# Patient Record
Sex: Female | Born: 1975
Health system: Southern US, Community
[De-identification: ages and names within clinical notes are randomized; demographics above are authoritative.]

## PROBLEM LIST (undated history)

## (undated) DIAGNOSIS — F32A Depression, unspecified: Secondary | ICD-10-CM

## (undated) DIAGNOSIS — E039 Hypothyroidism, unspecified: Secondary | ICD-10-CM

## (undated) DIAGNOSIS — I10 Essential (primary) hypertension: Secondary | ICD-10-CM

## (undated) DIAGNOSIS — E669 Obesity, unspecified: Secondary | ICD-10-CM

## (undated) DIAGNOSIS — F419 Anxiety disorder, unspecified: Secondary | ICD-10-CM

## (undated) DIAGNOSIS — R600 Localized edema: Secondary | ICD-10-CM

## (undated) DIAGNOSIS — E559 Vitamin D deficiency, unspecified: Secondary | ICD-10-CM

## (undated) DIAGNOSIS — F329 Major depressive disorder, single episode, unspecified: Secondary | ICD-10-CM

## (undated) DIAGNOSIS — K219 Gastro-esophageal reflux disease without esophagitis: Secondary | ICD-10-CM

## (undated) DIAGNOSIS — A159 Respiratory tuberculosis unspecified: Secondary | ICD-10-CM

## (undated) DIAGNOSIS — N979 Female infertility, unspecified: Secondary | ICD-10-CM

## (undated) HISTORY — DX: Hypothyroidism, unspecified: E03.9

## (undated) HISTORY — DX: Essential (primary) hypertension: I10

## (undated) HISTORY — PX: WISDOM TOOTH EXTRACTION: SHX21

## (undated) HISTORY — DX: Vitamin D deficiency, unspecified: E55.9

## (undated) HISTORY — DX: Localized edema: R60.0

## (undated) HISTORY — DX: Obesity, unspecified: E66.9

---
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
## 1999-01-20 ENCOUNTER — Other Ambulatory Visit: Admission: RE | Admit: 1999-01-20 | Discharge: 1999-01-20 | Payer: Self-pay | Admitting: *Deleted

## 2000-01-11 ENCOUNTER — Other Ambulatory Visit: Admission: RE | Admit: 2000-01-11 | Discharge: 2000-01-11 | Payer: Self-pay | Admitting: *Deleted

## 2000-04-17 DIAGNOSIS — A159 Respiratory tuberculosis unspecified: Secondary | ICD-10-CM

## 2000-04-17 HISTORY — DX: Respiratory tuberculosis unspecified: A15.9

## 2001-02-21 ENCOUNTER — Ambulatory Visit (HOSPITAL_COMMUNITY): Admission: RE | Admit: 2001-02-21 | Discharge: 2001-02-21 | Payer: Self-pay | Admitting: Infectious Diseases

## 2001-02-21 ENCOUNTER — Encounter: Payer: Self-pay | Admitting: Infectious Diseases

## 2001-03-28 ENCOUNTER — Other Ambulatory Visit: Admission: RE | Admit: 2001-03-28 | Discharge: 2001-03-28 | Payer: Self-pay | Admitting: Obstetrics and Gynecology

## 2002-03-31 ENCOUNTER — Other Ambulatory Visit: Admission: RE | Admit: 2002-03-31 | Discharge: 2002-03-31 | Payer: Self-pay | Admitting: Obstetrics and Gynecology

## 2002-03-31 ENCOUNTER — Other Ambulatory Visit: Admission: RE | Admit: 2002-03-31 | Discharge: 2002-03-31 | Payer: Self-pay | Admitting: *Deleted

## 2002-07-07 ENCOUNTER — Other Ambulatory Visit: Admission: RE | Admit: 2002-07-07 | Discharge: 2002-07-07 | Payer: Self-pay | Admitting: Obstetrics and Gynecology

## 2003-01-02 ENCOUNTER — Ambulatory Visit (HOSPITAL_COMMUNITY): Admission: RE | Admit: 2003-01-02 | Discharge: 2003-01-02 | Payer: Self-pay | Admitting: *Deleted

## 2003-04-15 ENCOUNTER — Other Ambulatory Visit: Admission: RE | Admit: 2003-04-15 | Discharge: 2003-04-15 | Payer: Self-pay | Admitting: Obstetrics and Gynecology

## 2004-05-17 ENCOUNTER — Other Ambulatory Visit: Admission: RE | Admit: 2004-05-17 | Discharge: 2004-05-17 | Payer: Self-pay | Admitting: Gynecology

## 2005-05-23 ENCOUNTER — Other Ambulatory Visit: Admission: RE | Admit: 2005-05-23 | Discharge: 2005-05-23 | Payer: Self-pay | Admitting: Gynecology

## 2006-01-14 ENCOUNTER — Inpatient Hospital Stay (HOSPITAL_COMMUNITY): Admission: AD | Admit: 2006-01-14 | Discharge: 2006-01-14 | Payer: Self-pay | Admitting: Obstetrics and Gynecology

## 2006-02-19 ENCOUNTER — Inpatient Hospital Stay (HOSPITAL_COMMUNITY): Admission: AD | Admit: 2006-02-19 | Discharge: 2006-02-19 | Payer: Self-pay | Admitting: Obstetrics and Gynecology

## 2006-04-05 ENCOUNTER — Inpatient Hospital Stay (HOSPITAL_COMMUNITY): Admission: AD | Admit: 2006-04-05 | Discharge: 2006-04-05 | Payer: Self-pay | Admitting: Obstetrics and Gynecology

## 2006-04-11 ENCOUNTER — Inpatient Hospital Stay (HOSPITAL_COMMUNITY): Admission: AD | Admit: 2006-04-11 | Discharge: 2006-04-11 | Payer: Self-pay | Admitting: Obstetrics and Gynecology

## 2006-04-12 ENCOUNTER — Ambulatory Visit (HOSPITAL_COMMUNITY): Admission: RE | Admit: 2006-04-12 | Discharge: 2006-04-12 | Payer: Self-pay | Admitting: Obstetrics and Gynecology

## 2006-05-15 ENCOUNTER — Inpatient Hospital Stay (HOSPITAL_COMMUNITY): Admission: AD | Admit: 2006-05-15 | Discharge: 2006-05-15 | Payer: Self-pay | Admitting: Obstetrics and Gynecology

## 2006-05-18 ENCOUNTER — Inpatient Hospital Stay (HOSPITAL_COMMUNITY): Admission: AD | Admit: 2006-05-18 | Discharge: 2006-05-23 | Payer: Self-pay | Admitting: Obstetrics and Gynecology

## 2006-05-20 ENCOUNTER — Encounter (INDEPENDENT_AMBULATORY_CARE_PROVIDER_SITE_OTHER): Payer: Self-pay | Admitting: *Deleted

## 2008-01-20 ENCOUNTER — Inpatient Hospital Stay (HOSPITAL_COMMUNITY): Admission: RE | Admit: 2008-01-20 | Discharge: 2008-01-22 | Payer: Self-pay | Admitting: Obstetrics and Gynecology

## 2009-11-15 ENCOUNTER — Ambulatory Visit: Payer: Self-pay | Admitting: Family Medicine

## 2009-11-15 DIAGNOSIS — M25559 Pain in unspecified hip: Secondary | ICD-10-CM

## 2010-05-19 NOTE — Assessment & Plan Note (Signed)
Summary: NP,HIP PAIN,MC   Vital Signs:  Patient profile:   35 year old female Height:      63 inches Weight:      127.25 pounds BMI:     22.62 Pulse rate:   65 / minute BP sitting:   109 / 74  (left arm)  Vitals Entered By: Terese Door (November 15, 2009 2:42 PM) CC: NP-HIP PAIN SINCE MARCH Pain Assessment Patient in pain? yes     Location: hip Intensity: 3 OR 4   CC:  NP-HIP PAIN SINCE MARCH.  History of Present Illness: LEFT hip and upper lateral thigh pain for several months. No specific injury. No pruior surgeries--did have a steroid injection and that helped minimally. This was about 6 weeks ago. Getting ready to train for 5k in October and wants to make sure nothing is wrong with her hip.  Pain is mostly at night--cannot sleep on that side. Also painful if she sits crosslegged.  Occasionally awakens her from sleep.  Current Medications (verified): 1)  None  Allergies (verified): No Known Drug Allergies  Review of Systems       no nummbness or tingling in leg, no weakness or giving way of leg.  Physical Exam  General:  alert, well-developed, well-nourished, and well-hydrated.     Hip Exam  Sensory:    Gross coordination and sensation were normal.  Motor:    Motor strength 5/5 bilaterally for quadriceps, hamstrings, ankle dorsiflexion, ankle plantar flexion, .  Hip Exam:    Right:    Inspection:  Normal    Palpation:  Normal    Left:    Inspection:  Normal       Location:  slighhtly TTP over greater trochanter    Stability:  stable    Swelling:  no    Erythema:  no    Full IR /ER, flexion and extension of hip. Mildy painful FABER.  Adductor and abductor muscle strength symmetrical but ABductors are a little wek B.   Impression & Recommendations:  Problem # 1:  HIP PAIN, LEFT (ICD-719.45) still think there is a component og greater trochanteric bursitis. Plus some weak abductors. Start her on lateral raises for abductors, also quadriped  extensions. RTC if not improved

## 2010-08-30 NOTE — Op Note (Signed)
[name], [name]             ACCOUNT NO.:  [id number]   MEDICAL RECORD NO.:  [id number]          PATIENT TYPE:  INP   LOCATION:  9110                          FACILITY:  [facility]   PHYSICIAN:  Huel Cote, M.D. DATE OF BIRTH:  23-Jul-1975   DATE OF PROCEDURE:  DATE OF DISCHARGE:                               OPERATIVE REPORT   PREOPERATIVE DIAGNOSES:  1. Repeat cesarean section, declines vaginal birth after cesarean.  2. A 39-week gestation.   POSTOPERATIVE DIAGNOSES:  1. Repeat cesarean section, declines vaginal birth after cesarean.  2. A 39-week gestation.   PROCEDURE:  Repeat low-transverse cesarean section.   SURGEON:  Huel Cote, MD   ASSISTANT:  Zenaida Niece, MD   ANESTHESIA:  Spinal.   FINDINGS:  A viable female infant, vertex presentation, Apgars were 8  and 9, weight was 7 pounds and 8 ounces.  Normal uterus, ovaries, and  tubes were noted.   SPECIMEN:  Placenta was sent to Labor and Delivery after cord blood  donation.   ESTIMATED BLOOD LOSS:  800 mL.   URINE OUTPUT:  300 mL clear urine and 2000 mL LR.   PROCEDURE IN DETAIL:  The patient was taken to the operating room where  spinal anesthesia was obtained without difficulty.  She was then prepped  and draped in the normal sterile fashion in the dorsal supine position  with a leftward tilt.  A Pfannenstiel skin incision was then made and  carried through to the underlying layer of fascia by sharp dissection  and Bovie cautery.  Fascia was then nicked in the midline and the  incision was extended laterally with Mayo scissors.  The inferior aspect  of the incision was grasped with Kocher clamps, elevated and dissected  off the underlying rectus muscles in a similar fashion.  The superior  aspect was dissected off the rectus muscles.  These were then elevated  and separated sharply with peritoneal cavity identified and also entered  sharply.  The peritoneal incision was then extended both  superiorly and  inferiorly with careful attention to avoid both bowel and bladder.  The  Alexis self-retaining wound retractor was then placed within the  incision, and the lower uterine segment exposed nicely.  The lower  uterine segment was then incised in a transverse fashion.  The uterine  cavity itself entered bluntly.  The amniotic sac was then ruptured with  clear fluid noted.  The infant's head then delivered atraumatically and  bulb suctioned.  The remainder of the body delivered without difficulty,  and cord was clamped and cut and handed off to awaiting pediatricians.  The placenta was expressed spontaneously and handed off for cord blood  donation.  The uterus was cleared of all clots and debris with a moist  lap sponge.  The uterine incision was then closed with 0 chromic in a  running-locked fashion in the first layer, and a second layer of the  same suture in an imbricating fashion.  A small area of bleeding at the  midline was controlled with a figure-of-eight suture of 3-0 Vicryl.  The  incision then appeared  completely hemostatic.  Tubes and ovaries  appeared normal.  The gutters were cleared of all clots and debris, and  the abdomen irrigated with no further bleeding noted.  Therefore, the  rectus muscles reapproximated with several interrupted sutures of 0  Vicryl in a mattress suture interrupted, and the fascia was then closed  with 0 Vicryl in a running fashion.  The skin was closed after the  subcutaneous layer was reapproximated with 3-0 plain with staples.  Sponge, lap, and needle counts were correct x2, and the patient was  taken to the recovery room in stable condition.      Huel Cote, M.D.  Electronically Signed     KR/MEDQ  D:  01/20/2008  T:  01/20/2008  Job:  [id number]

## 2010-08-30 NOTE — H&P (Signed)
[name], [name]             ACCOUNT NO.:  [id number]   MEDICAL RECORD NO.:  [id number]          PATIENT TYPE:  INP   LOCATION:                                [facility]  [facility]   PHYSICIAN:  Huel Cote, M.D. DATE OF BIRTH:  07/10/1975   DATE OF ADMISSION:  01/20/2008  DATE OF DISCHARGE:                              HISTORY & PHYSICAL   The patient is a 35 year old G2, P 1-0-0-2, who is coming in for a  scheduled repeat C-section at 27 plus weeks' gestation given a prior C-  section and an unfavorable cervix with the patient declining a trial of  labor in this situation.  Her estimated due date is January 25, 2008 by  an 8-week ultrasound consistent with her LMP.  Her prenatal care overall  has been relatively uneventful except for the history of C-section.  In  her last pregnancy, she did have some pre-eclampsia; however, this  pregnancy, blood pressures have remained normal and no other issues.   Her prenatal labs are as follows; O positive, antibody negative, RPR  nonreactive, rubella immune, hepatitis B surface-antigen negative, HIV  negative, GC negative, Chlamydia negative, group B Strep negative, 1-  hour Glucola normal at 110.  The patient had first-trimester screening,  which was normal and an alpha-fetoprotein, which was normal.  Her  anatomic ultrasound was normal.   Her past obstetrical history as stated was significant for a prior C-  section in February 2008 with a twin gestation.  The patient had arrest  of dilation after labor and also the complication of postpartum  preeclampsia that was at 38 weeks' gestation.   Her past GYN history was normal.   PAST SURGICAL HISTORY:  The C-section as stated and a pilonidal cyst  surgery in 1994 as well as dental surgery and wisdom teeth removal in  the past.   PAST MEDICAL HISTORY:  She had a positive PPD in the past with a  negative chest x-ray.   CURRENT ALLERGIES:  None.   CURRENT MEDICATIONS:  Prenatal  vitamins.   PHYSICAL EXAMINATION:  The patient's blood pressure is 115/80.  Cardiac  exam is regular rate and rhythm.  Lungs are clear.  Abdomen is soft,  nontender, and gravid.  Cervix is 50, closed, and high.  She had a  weight of 161 pounds.  Lower extremities were normal with no significant  edema.   The risks and benefits of VBAC versus repeat C-section were discussed  with the patient in detail including bleeding, infection, and possible  damage to bowel and bladder.  She understands the risks of both  approaches and desires to proceed with a repeat C-section on January 20, 2008, if she does not labor prior to that date, as her cervix has  remained unfavorable.  She has been appropriately counseled and desires  to proceed with the surgery as stated.      Huel Cote, M.D.  Electronically Signed     KR/MEDQ  D:  01/08/2008  T:  01/09/2008  Job:  [id number]

## 2010-09-02 NOTE — Discharge Summary (Signed)
NAMECHRISTYN, [name]             ACCOUNT NO.:  [id number]   MEDICAL RECORD NO.:  [id number]          PATIENT TYPE:  INP   LOCATION:  9110                          [facility]ILITY:  [facility]   PHYSICIAN:  Huel Cote, M.D. DATE OF BIRTH:  02/12/1976   DATE OF ADMISSION:  01/20/2008  DATE OF DISCHARGE:  01/22/2008                               DISCHARGE SUMMARY   DISCHARGE DIAGNOSES:  1. Repeat cesarean section, declined vaginal birth after cesarean.  2. A 39-week gestation.  3. Status post repeat low-transverse cesarean section.   DISCHARGE MEDICATIONS:  1. Motrin 600 mg p.o. every 6 hours.  2. Percocet 1-2 tablets p.o. every 4 hours p.r.n.   DISCHARGE FOLLOWUP:  The patient is to follow up in the office in 2 days  for staple removal.   HOSPITAL COURSE:  The patient is a 35 year old G2, P1-0-0-2 who came in  for a scheduled repeat C-section given her term status and declining a  trial of labor.  For her complete H&P, please see that previously  dictated documents.  On January 20, 2008, she underwent a repeat low-  transverse C-section and was delivered of a viable female infant, Apgars  were 8 and 9, weight was 7 pounds 8 ounces.  She was then admitted for  routine postoperative care.  On postop day #1, she was doing well.  Her  hemoglobin was down 9.8 from 12.5.  She was tolerating her p.o. pain  medications.  By postop day #2, she was feeling quite well, voiding  without difficulty and her pain was well controlled.  She requested  early discharge, therefore was discharged to home to follow up in the  office in 2 days for staple removal.      Huel Cote, M.D.  Electronically Signed     KR/MEDQ  D:  02/23/2008  T:  02/23/2008  Job:  [id number]

## 2010-09-02 NOTE — Discharge Summary (Signed)
[name], [name]             ACCOUNT NO.:  [id number]   MEDICAL RECORD NO.:  [id number]          PATIENT TYPE:  INP   LOCATION:  9139                          [facility]   PHYSICIAN:  Zenaida Niece, M.D.DATE OF BIRTH:  1975/10/10   DATE OF ADMISSION:  05/18/2006  DATE OF DISCHARGE:  05/23/2006                               DISCHARGE SUMMARY   ADMISSION DIAGNOSIS:  Intrauterine pregnancy with twins at 38 weeks.   DISCHARGE DIAGNOSES:  1. Intrauterine pregnancy of twins at 38 weeks.  2. Arrest of dilation.  3. Possible postpartum preeclampsia.   HISTORY AND PHYSICAL:  This is a 35 year old white female gravida 1,  para 0 with twins with an EGA of [redacted] weeks who presents for elective  induction.  Prenatal care revealed concordant growth of the twins with  most recent vertex-transverse presentation.  She also had recent  elevated blood pressures in the office with no symptoms and normal labs.  Prenatal labs:  Blood type is O positive with a negative antibody  screen, RPR nonreactive, rubella immune, hepatitis B surface antigen  negative, HIV negative, gonorrhea and chlamydia negative, 1-hour Glucola  99, and group B strep is negative.  Past medical history:  Positive PPD  with a negative chest x-ray.  Past surgical history:  Pilonidal cyst  removal, wisdom tooth removal, and surgery for a fractured arm.  Physical exam:  She is afebrile with stable vital signs with initial  blood pressure 130/90.  Fetal heart tracing is reactive x2.  Abdomen is  gravid, nontender, with fundal height of 44 cm.  Cervix is 1, 30, -3,  vertex presentation, and an adequate pelvis.   HOSPITAL COURSE:  The patient was admitted and started on Pitocin for  induction.  This was done on the evening of May 18, 2006.  On the  morning of February 2 amniotomy was performed and she was 2-3 cm  dilated.  This revealed clear fluid and a fetal scalp electrode was  placed.  Throughout the day on February  2 she gradually progressed to 3-  4 cm.  She had normal PIH labs on admission and blood pressures remained  stable.  However, repeat labs on the evening of February 2 revealed  creatinine had bumped slightly upward but was still normal, uric acid  had increased, AST had slightly increased, ALT had slightly increased;  again, slightly elevated from before but still in the normal range.  She  continued to contract overnight but did not progress past 3-4 cm.  Thus,  on the morning of February 3 she underwent a primary low transverse  cesarean section.  Baby A was vertex, female, Apgars of 9 and 10, the  weight 6 pounds 8 ounces.  Baby B was vertex, female, Apgars of 9 and  10, that weighed 5 pounds 14 ounces.  She had repeat PIH labs drawn that  evening.  This revealed a hemoglobin down to 10.4 from 12.1.  Creatinine  bumped from 0.6 to 0.8.  LDH increased from 123 to 253, platelets fell  from 158 to 124, AST rose from 36 to 61,  ALT rose from 31 to 43, and  uric acid rose from 6.4 to 7.6.  Blood pressures remained normal.  Urine  output was slightly decreased.  She did have significant edema which  developed during labor.  Due to her edema and the change in her labs she  was put on magnesium for seizure prophylaxis, and given a dose of Lasix  due to decreased urine output and her edema.  She also developed a rash  from her legs to her waist in the same distribution as her edema.  Hemoglobin fell to 9.8; platelets fell to 110,000; liver enzymes  remained stable; uric acid rose to 8.1; and the remainder of her labs  were stable.  Blood pressure remained stable and urine output remained  adequate.  She received one more dose of Lasix to help improve urine  output.  On the morning of February 5, which was postoperative day #3,  blood pressures were stable, urine output was improved and labs were  stable.  At that time she was felt to be stable enough to discontinue  her magnesium.  She was put on  hydrochlorothiazide to maybe help with  some of her edema and control her blood pressure.  On the morning of  postoperative day #3 she was stable and was felt to be appropriate for  discharge.  Again, she did have this rash on her legs, lower abdomen and  low back in the same distribution as her edema and no obvious source for  this was found.  She was treated with p.o. Benadryl.  She was discharged  on February 6.   DISCHARGE INSTRUCTIONS:  Regular diet, pelvic rest, no strenuous  activity.  Followup is in 2 weeks for incision check.  Medications are  Darvocet p.r.n. pain and over-the-counter ibuprofen.  Prior to discharge  the nurse did remove her staples and apply Steri-Strips to her abdominal  incision.      Zenaida Niece, M.D.  Electronically Signed     TDM/MEDQ  D:  07/09/2006  T:  07/09/2006  Job:  [id number]

## 2010-09-02 NOTE — Op Note (Signed)
[name], [name]             ACCOUNT NO.:  [id number]   MEDICAL RECORD NO.:  [id number]          PATIENT TYPE:  INP   LOCATION:  9372                          [facility]   PHYSICIAN:  Zenaida Niece, M.D.DATE OF BIRTH:  1975-11-30   DATE OF PROCEDURE:  05/20/2006  DATE OF DISCHARGE:                               OPERATIVE REPORT   PREOPERATIVE AND POSTOPERATIVE DIAGNOSES:  Twin intrauterine pregnancy  at 38 weeks and arrest of dilation.   PROCEDURE:  Primary low transverse cesarean section without extensions.   SURGEON:  Zenaida Niece, M.D.   ANESTHESIA:  Epidural.   ESTIMATED BLOOD LOSS:  1000 mL.   COMPLICATIONS:  None.   PATHOLOGY:  Placenta x2 sent to pathology.   FINDINGS:  The patient had normal anatomy.  Baby A was vertex female with  Apgars of 9 and 10, weighed 6 pounds 8 ounces.  Baby B was vertex  female, Apgars of 9 and 10 and weighed 5 pounds 14 ounces.   PROCEDURE IN DETAIL:  The patient was taken to the operating room and  placed in the dorsosupine position with left lateral tilt.  Her  previously placed epidural was dosed appropriately.  Her abdomen was  then prepped and draped in the usual sterile fashion.  The level of her  anesthesia was found to be adequate and her abdomen was entered via a  standard Pfannenstiel's incision.  Bladder blade was placed and a 4 cm  transverse incision was made in the lower uterine segment pushing the  bladder inferior.  The incision was extended bilaterally digitally.  Baby A was grasped and the vertex was delivered through the incision  atraumatically.  Mouth and nares were suctioned and the remainder of the  infant delivered atraumatically.  Cord was doubly clamped and cut and  the infant handed to the awaiting pediatric team.  Baby B then came down  into the uterine incision in a vertex presentation.  Membranes were  ruptured and revealed clear fluid.  The vertex was easily delivered.  Mouth and nares were  suctioned and the remainder of the infant then  delivered atraumatically.  The cord was doubly clamped, cut and the  infant handed to the awaiting pediatric team.  Cord blood was then  obtained from A and then B with a cord clamp being placed on Baby A's  cord.  Placentas were then delivered manually.  Uterus was wiped dry  with a clean lap pad and all clots and debris removed.  Uterine incision  was inspected and found to be free of extensions.  Uterine incision was  closed in one layer being a running locking layer with number 1 chromic  with adequate hemostasis.  Tubes and ovaries were inspected and found to  be normal.  Uterine incision was again inspected and found to be  hemostatic.  Subfascial space was then irrigated and made hemostatic  with electrocautery.  Fascia was closed in running fashion starting at  both ends and meeting in the middle with 0 Vicryl.  Subcutaneous tissue  was irrigated and made  hemostatic with electrocautery and  the skin was closed with staples.  Sterile dressing was then applied.  The patient tolerated the procedure  well and was taken to the recovery room in stable condition.  Counts  were correct and she received Ancef 1 gram after cord clamp.      Zenaida Niece, M.D.  Electronically Signed     TDM/MEDQ  D:  05/20/2006  T:  05/20/2006  Job:  [id number]

## 2011-01-16 LAB — CBC
HCT: 37.5
Hemoglobin: 9.8 — ABNORMAL LOW
Platelets: 160
RBC: 2.99 — ABNORMAL LOW
WBC: 10
WBC: 7.9

## 2011-01-16 LAB — RPR: RPR Ser Ql: NONREACTIVE

## 2011-07-05 LAB — OB RESULTS CONSOLE RUBELLA ANTIBODY, IGM: Rubella: IMMUNE

## 2011-07-05 LAB — OB RESULTS CONSOLE ABO/RH: RH Type: POSITIVE

## 2011-07-05 LAB — OB RESULTS CONSOLE HIV ANTIBODY (ROUTINE TESTING): HIV: NONREACTIVE

## 2011-07-05 LAB — OB RESULTS CONSOLE HEPATITIS B SURFACE ANTIGEN: Hepatitis B Surface Ag: NEGATIVE

## 2011-07-05 LAB — OB RESULTS CONSOLE RPR: RPR: NONREACTIVE

## 2012-01-11 ENCOUNTER — Encounter (HOSPITAL_COMMUNITY): Payer: Self-pay | Admitting: Pharmacist

## 2012-01-12 ENCOUNTER — Encounter (HOSPITAL_COMMUNITY): Payer: Self-pay

## 2012-01-15 ENCOUNTER — Encounter (HOSPITAL_COMMUNITY)
Admission: RE | Admit: 2012-01-15 | Discharge: 2012-01-15 | Disposition: A | Discharge status: Home / Self Care | Payer: 59 | Source: Ambulatory Visit | Attending: Obstetrics and Gynecology | Admitting: Obstetrics and Gynecology

## 2012-01-15 ENCOUNTER — Encounter (HOSPITAL_COMMUNITY): Payer: Self-pay

## 2012-01-15 HISTORY — DX: Anxiety disorder, unspecified: F41.9

## 2012-01-15 HISTORY — DX: Depression, unspecified: F32.A

## 2012-01-15 HISTORY — DX: Gastro-esophageal reflux disease without esophagitis: K21.9

## 2012-01-15 HISTORY — DX: Respiratory tuberculosis unspecified: A15.9

## 2012-01-15 HISTORY — DX: Major depressive disorder, single episode, unspecified: F32.9

## 2012-01-15 HISTORY — DX: Female infertility, unspecified: N97.9

## 2012-01-15 LAB — CBC
Hemoglobin: 12 g/dL (ref 12.0–15.0)
Platelets: 163 10*3/uL (ref 150–400)
RBC: 3.86 MIL/uL — ABNORMAL LOW (ref 3.87–5.11)

## 2012-01-15 LAB — TYPE AND SCREEN: Antibody Screen: NEGATIVE

## 2012-01-15 LAB — ABO/RH: ABO/RH(D): O POS

## 2012-01-15 NOTE — Patient Instructions (Addendum)
   Your procedure is scheduled on: Wednesday, Oct 9th  Enter through the [employer] of [facility] at: 815 am Pick up the phone at the desk and dial [phone] and inform us of your arrival.  Please call this number if you have any problems the morning of surgery: [phone]  Remember: Do not eat food after midnight: Tuesday Do not drink clear liquids after: Tuesday Take these medicines the morning of surgery with a SIP OF WATER: Will take nexium Tuesday night.  May take prenatal DOS with small sip of water.  Do not wear jewelry, make-up, or FINGER nail polish No metal in your hair or on your body. Do not wear lotions, powders, perfumes. You may wear deodorant.  Please use your CHG wash as directed prior to surgery.  Do not shave anywhere for at least 12 hours prior to first CHG shower.  Do not bring valuables to the hospital. Contacts, dentures or bridgework may not be worn into surgery.  Leave suitcase in the car. After Surgery it may be brought to your room. For patients being admitted to the hospital, checkout time is 11:00am the day of discharge.  Home with Husband Zella Ball or mother Darl Pikes  Patients discharged on the day of surgery will not be allowed to drive home.

## 2012-01-23 NOTE — H&P (Signed)
[name] is a 36 y.o. female G3P2002 at 39+ weeks (EDD 01/30/12 by LMP c/w 8 week [place]) presenting for repeat C-section given h/o of two prior c-sections.  Prenatal care has been uneventful. She desires permanent sterilization as well with her surgery.  She had a h/o infertility with her first pregnancy the result of IVF but has conceived spontaneously x 2 since then.   Maternal Medical History:  Fetal activity: Perceived fetal activity is normal.      OB History    Grav Para Term Preterm Abortions TAB SAB Ect Mult Living   3 2 2       3     2008 IVF twins 6#8oz and 5#14oz  C/S for FTP 2009 Repeat C/S 7#8oz  Past Medical History  Diagnosis Date  . GERD (gastroesophageal reflux disease)   . Anxiety     no meds  . Depression     no meds  . Tuberculosis 2002    tested positive but no active tb, neg test xray  . Infertility, female    Past Surgical History  Procedure Date  . Wisdom tooth extraction   . Cesarean section 2008, 2009    x 2  . Excision of pilonidal cyst 1994   Family History: family history is not on file. Social History:  reports that she has never smoked. She has never used smokeless tobacco. She reports that she does not drink alcohol or use illicit drugs.   Prenatal Transfer Tool  Maternal Diabetes: No Genetic Screening: Normal Maternal Ultrasounds/Referrals: Normal Fetal Ultrasounds or other Referrals:  None Maternal Substance Abuse:  No Significant Maternal Medications:  None Significant Maternal Lab Results:  None Other Comments:  None  ROS    There were no vitals taken for this visit. Maternal Exam:  Uterine Assessment: Contraction strength is mild.  Contraction frequency is irregular.   Abdomen: Fetal presentation: vertex  Introitus: Normal vulva. Normal vagina.    Physical Exam  Constitutional: She is oriented to person, place, and time. She appears well-developed and well-nourished.  Cardiovascular: Normal rate and regular rhythm.     Respiratory: Effort normal and breath sounds normal.  GI: Soft. Bowel sounds are normal.  Genitourinary: Vagina normal and uterus normal.       FH 41cm  Neurological: She is alert and oriented to person, place, and time.  Psychiatric: She has a normal mood and affect. Her behavior is normal.    Prenatal labs: ABO, Rh: --/--/O POS (09/30 1140) Antibody: NEG (09/30 1128) Rubella: Immune (03/20 0000) RPR: NON REACTIVE (09/30 1128)  HBsAg: Negative (03/20 0000)  HIV: Non-reactive (03/20 0000)  GBS:   Negative One hour GTT 125 First trimester screen and AFP negative   Assessment/Plan: Pt for repeat C/S and BTL desires sterility.  We discussed the risks and benefits of c-section including bleeding, infection and possible damage to bowel and bladder.  Pt desires to proceed.  We also discussed permanent sterilization and the risk of failure of 1/100.  The patient is aware of increased risk of ectopic should pregnancy occur.    Oliver Pila 01/23/2012, 1:05 PM

## 2012-01-24 ENCOUNTER — Inpatient Hospital Stay (HOSPITAL_COMMUNITY): Payer: 59 | Admitting: Anesthesiology

## 2012-01-24 ENCOUNTER — Inpatient Hospital Stay (HOSPITAL_COMMUNITY)
Admission: AD | Admit: 2012-01-24 | Discharge: 2012-01-26 | DRG: 766 | Disposition: A | Discharge status: Home / Self Care | Payer: 59 | Source: Ambulatory Visit | Attending: Obstetrics and Gynecology | Admitting: Obstetrics and Gynecology

## 2012-01-24 ENCOUNTER — Encounter (HOSPITAL_COMMUNITY): Payer: Self-pay | Admitting: Anesthesiology

## 2012-01-24 ENCOUNTER — Encounter (HOSPITAL_COMMUNITY): Payer: Self-pay | Admitting: *Deleted

## 2012-01-24 ENCOUNTER — Encounter (HOSPITAL_COMMUNITY)
Admission: AD | Disposition: A | Discharge status: Home / Self Care | Payer: Self-pay | Source: Ambulatory Visit | Attending: Obstetrics and Gynecology

## 2012-01-24 DIAGNOSIS — O09529 Supervision of elderly multigravida, unspecified trimester: Secondary | ICD-10-CM | POA: Diagnosis present

## 2012-01-24 DIAGNOSIS — Z98891 History of uterine scar from previous surgery: Secondary | ICD-10-CM

## 2012-01-24 DIAGNOSIS — Z302 Encounter for sterilization: Secondary | ICD-10-CM

## 2012-01-24 DIAGNOSIS — O34219 Maternal care for unspecified type scar from previous cesarean delivery: Principal | ICD-10-CM | POA: Diagnosis present

## 2012-01-24 LAB — TYPE AND SCREEN
ABO/RH(D): O POS
Antibody Screen: NEGATIVE

## 2012-01-24 SURGERY — Surgical Case
Anesthesia: Spinal | Site: Abdomen | Laterality: Bilateral | Wound class: Clean Contaminated

## 2012-01-24 MED ORDER — SODIUM CHLORIDE 0.9 % IV SOLN
1.0000 ug/kg/h | INTRAVENOUS | Status: DC | PRN
  Filled 2012-01-24: qty 2.5

## 2012-01-24 MED ORDER — FENTANYL CITRATE 0.05 MG/ML IJ SOLN: 25.0000 ug | INTRAMUSCULAR | Status: DC | PRN

## 2012-01-24 MED ORDER — LACTATED RINGERS IV SOLN
INTRAVENOUS | Status: DC
  Administered 2012-01-24 (×2): via INTRAVENOUS

## 2012-01-24 MED ORDER — DIPHENHYDRAMINE HCL 50 MG/ML IJ SOLN: 12.5000 mg | INTRAMUSCULAR | Status: DC | PRN

## 2012-01-24 MED ORDER — LACTATED RINGERS IV SOLN
INTRAVENOUS | Status: DC | PRN
  Administered 2012-01-24: 10:00:00 via INTRAVENOUS

## 2012-01-24 MED ORDER — ONDANSETRON HCL 4 MG/2ML IJ SOLN
INTRAMUSCULAR | Status: AC
  Filled 2012-01-24: qty 2

## 2012-01-24 MED ORDER — MEPERIDINE HCL 25 MG/ML IJ SOLN: 6.2500 mg | INTRAMUSCULAR | Status: DC | PRN

## 2012-01-24 MED ORDER — SCOPOLAMINE 1 MG/3DAYS TD PT72
1.0000 | MEDICATED_PATCH | Freq: Once | TRANSDERMAL | Status: DC
  Administered 2012-01-24: 1.5 mg via TRANSDERMAL

## 2012-01-24 MED ORDER — OXYTOCIN 10 UNIT/ML IJ SOLN
40.0000 [IU] | INTRAVENOUS | Status: DC | PRN
  Administered 2012-01-24: 40 [IU] via INTRAVENOUS

## 2012-01-24 MED ORDER — NALBUPHINE HCL 10 MG/ML IJ SOLN
5.0000 mg | INTRAMUSCULAR | Status: DC | PRN
  Administered 2012-01-24 (×2): 5 mg via INTRAVENOUS
  Filled 2012-01-24 (×2): qty 1

## 2012-01-24 MED ORDER — IBUPROFEN 600 MG PO TABS
600.0000 mg | ORAL_TABLET | Freq: Four times a day (QID) | ORAL | Status: DC
  Administered 2012-01-25 – 2012-01-26 (×7): 600 mg via ORAL
  Filled 2012-01-24 (×6): qty 1

## 2012-01-24 MED ORDER — FENTANYL CITRATE 0.05 MG/ML IJ SOLN
INTRAMUSCULAR | Status: AC
  Filled 2012-01-24: qty 2

## 2012-01-24 MED ORDER — LACTATED RINGERS IV SOLN
INTRAVENOUS | Status: DC
Start: 2012-01-24 — End: 2012-01-24

## 2012-01-24 MED ORDER — PHENYLEPHRINE 40 MCG/ML (10ML) SYRINGE FOR IV PUSH (FOR BLOOD PRESSURE SUPPORT)
PREFILLED_SYRINGE | INTRAVENOUS | Status: AC
  Filled 2012-01-24: qty 10

## 2012-01-24 MED ORDER — ACETAMINOPHEN 10 MG/ML IV SOLN
1000.0000 mg | Freq: Four times a day (QID) | INTRAVENOUS | Status: AC | PRN
  Filled 2012-01-24: qty 100

## 2012-01-24 MED ORDER — SCOPOLAMINE 1 MG/3DAYS TD PT72: 1.0000 | MEDICATED_PATCH | Freq: Once | TRANSDERMAL | Status: DC

## 2012-01-24 MED ORDER — PHENYLEPHRINE HCL 10 MG/ML IJ SOLN
INTRAMUSCULAR | Status: DC | PRN
  Administered 2012-01-24: 40 ug via INTRAVENOUS
  Administered 2012-01-24: 120 ug via INTRAVENOUS
  Administered 2012-01-24: 40 ug via INTRAVENOUS
  Administered 2012-01-24: 80 ug via INTRAVENOUS
  Administered 2012-01-24: 40 ug via INTRAVENOUS
  Administered 2012-01-24 (×3): 80 ug via INTRAVENOUS

## 2012-01-24 MED ORDER — TETANUS-DIPHTH-ACELL PERTUSSIS 5-2.5-18.5 LF-MCG/0.5 IM SUSP
0.5000 mL | Freq: Once | INTRAMUSCULAR | Status: DC
  Filled 2012-01-24: qty 0.5

## 2012-01-24 MED ORDER — ONDANSETRON HCL 4 MG/2ML IJ SOLN
INTRAMUSCULAR | Status: DC | PRN
  Administered 2012-01-24: 4 mg via INTRAVENOUS

## 2012-01-24 MED ORDER — KETOROLAC TROMETHAMINE 30 MG/ML IJ SOLN: 30.0000 mg | Freq: Four times a day (QID) | INTRAMUSCULAR | Status: AC | PRN

## 2012-01-24 MED ORDER — MIDAZOLAM HCL 2 MG/2ML IJ SOLN: 0.5000 mg | Freq: Once | INTRAMUSCULAR | Status: DC | PRN

## 2012-01-24 MED ORDER — MORPHINE SULFATE (PF) 0.5 MG/ML IJ SOLN
INTRAMUSCULAR | Status: DC | PRN
  Administered 2012-01-24: .1 mg via INTRATHECAL

## 2012-01-24 MED ORDER — PROMETHAZINE HCL 25 MG/ML IJ SOLN: 6.2500 mg | INTRAMUSCULAR | Status: DC | PRN

## 2012-01-24 MED ORDER — PANTOPRAZOLE SODIUM 40 MG PO TBEC
40.0000 mg | DELAYED_RELEASE_TABLET | Freq: Every day | ORAL | Status: DC
  Administered 2012-01-25 – 2012-01-26 (×2): 40 mg via ORAL
  Filled 2012-01-24 (×3): qty 1

## 2012-01-24 MED ORDER — WITCH HAZEL-GLYCERIN EX PADS: 1.0000 "application " | MEDICATED_PAD | CUTANEOUS | Status: DC | PRN

## 2012-01-24 MED ORDER — SODIUM CHLORIDE 0.9 % IJ SOLN: 3.0000 mL | INTRAMUSCULAR | Status: DC | PRN

## 2012-01-24 MED ORDER — OXYTOCIN 40 UNITS IN LACTATED RINGERS INFUSION - SIMPLE MED: 62.5000 mL/h | INTRAVENOUS | Status: AC

## 2012-01-24 MED ORDER — CEFAZOLIN SODIUM-DEXTROSE 2-3 GM-% IV SOLR
2.0000 g | INTRAVENOUS | Status: AC
  Administered 2012-01-24: 2 g via INTRAVENOUS

## 2012-01-24 MED ORDER — OXYCODONE-ACETAMINOPHEN 5-325 MG PO TABS
1.0000 | ORAL_TABLET | ORAL | Status: DC | PRN
  Administered 2012-01-24: 1 via ORAL
  Administered 2012-01-25 – 2012-01-26 (×4): 2 via ORAL
  Filled 2012-01-24 (×2): qty 2
  Filled 2012-01-24: qty 1
  Filled 2012-01-24 (×2): qty 2

## 2012-01-24 MED ORDER — SCOPOLAMINE 1 MG/3DAYS TD PT72
MEDICATED_PATCH | TRANSDERMAL | Status: AC
  Filled 2012-01-24: qty 1

## 2012-01-24 MED ORDER — ONDANSETRON HCL 4 MG/2ML IJ SOLN: 4.0000 mg | INTRAMUSCULAR | Status: DC | PRN

## 2012-01-24 MED ORDER — CEFAZOLIN SODIUM-DEXTROSE 2-3 GM-% IV SOLR
INTRAVENOUS | Status: AC
  Filled 2012-01-24: qty 50

## 2012-01-24 MED ORDER — SENNOSIDES-DOCUSATE SODIUM 8.6-50 MG PO TABS
2.0000 | ORAL_TABLET | Freq: Every day | ORAL | Status: DC
  Administered 2012-01-24 – 2012-01-25 (×2): 2 via ORAL

## 2012-01-24 MED ORDER — ONDANSETRON HCL 4 MG/2ML IJ SOLN: 4.0000 mg | Freq: Three times a day (TID) | INTRAMUSCULAR | Status: DC | PRN

## 2012-01-24 MED ORDER — PRENATAL MULTIVITAMIN CH
1.0000 | ORAL_TABLET | Freq: Every day | ORAL | Status: DC
  Administered 2012-01-25 – 2012-01-26 (×2): 1 via ORAL
  Filled 2012-01-24 (×2): qty 1

## 2012-01-24 MED ORDER — SIMETHICONE 80 MG PO CHEW
80.0000 mg | CHEWABLE_TABLET | Freq: Three times a day (TID) | ORAL | Status: DC
  Administered 2012-01-24 – 2012-01-26 (×5): 80 mg via ORAL

## 2012-01-24 MED ORDER — BUPIVACAINE IN DEXTROSE 0.75-8.25 % IT SOLN
INTRATHECAL | Status: DC | PRN
Start: 2012-01-24 — End: 2012-01-24
  Administered 2012-01-24: 1.5 mL via INTRATHECAL

## 2012-01-24 MED ORDER — DIPHENHYDRAMINE HCL 50 MG/ML IJ SOLN: 25.0000 mg | INTRAMUSCULAR | Status: DC | PRN

## 2012-01-24 MED ORDER — METOCLOPRAMIDE HCL 5 MG/ML IJ SOLN: 10.0000 mg | Freq: Three times a day (TID) | INTRAMUSCULAR | Status: DC | PRN

## 2012-01-24 MED ORDER — KETOROLAC TROMETHAMINE 60 MG/2ML IM SOLN
INTRAMUSCULAR | Status: AC
  Administered 2012-01-24: 60 mg via INTRAMUSCULAR
  Filled 2012-01-24: qty 2

## 2012-01-24 MED ORDER — DIBUCAINE 1 % RE OINT: 1.0000 "application " | TOPICAL_OINTMENT | RECTAL | Status: DC | PRN

## 2012-01-24 MED ORDER — FENTANYL CITRATE 0.05 MG/ML IJ SOLN
INTRAMUSCULAR | Status: DC | PRN
  Administered 2012-01-24: 25 ug via INTRATHECAL

## 2012-01-24 MED ORDER — LANOLIN HYDROUS EX OINT: 1.0000 "application " | TOPICAL_OINTMENT | CUTANEOUS | Status: DC | PRN

## 2012-01-24 MED ORDER — SIMETHICONE 80 MG PO CHEW: 80.0000 mg | CHEWABLE_TABLET | ORAL | Status: DC | PRN

## 2012-01-24 MED ORDER — ONDANSETRON HCL 4 MG PO TABS: 4.0000 mg | ORAL_TABLET | ORAL | Status: DC | PRN

## 2012-01-24 MED ORDER — KETOROLAC TROMETHAMINE 30 MG/ML IJ SOLN
30.0000 mg | Freq: Four times a day (QID) | INTRAMUSCULAR | Status: AC | PRN
  Administered 2012-01-24: 30 mg via INTRAVENOUS
  Filled 2012-01-24: qty 1

## 2012-01-24 MED ORDER — MORPHINE SULFATE 0.5 MG/ML IJ SOLN
INTRAMUSCULAR | Status: AC
  Filled 2012-01-24: qty 10

## 2012-01-24 MED ORDER — LACTATED RINGERS IV SOLN
INTRAVENOUS | Status: DC
  Administered 2012-01-24 (×4): via INTRAVENOUS

## 2012-01-24 MED ORDER — NALOXONE HCL 0.4 MG/ML IJ SOLN: 0.4000 mg | INTRAMUSCULAR | Status: DC | PRN

## 2012-01-24 MED ORDER — MENTHOL 3 MG MT LOZG: 1.0000 | LOZENGE | OROMUCOSAL | Status: DC | PRN

## 2012-01-24 MED ORDER — DIPHENHYDRAMINE HCL 25 MG PO CAPS
25.0000 mg | ORAL_CAPSULE | ORAL | Status: DC | PRN
  Administered 2012-01-25: 25 mg via ORAL
  Filled 2012-01-24 (×2): qty 1

## 2012-01-24 MED ORDER — PHENYLEPHRINE 40 MCG/ML (10ML) SYRINGE FOR IV PUSH (FOR BLOOD PRESSURE SUPPORT)
PREFILLED_SYRINGE | INTRAVENOUS | Status: AC
  Filled 2012-01-24: qty 5

## 2012-01-24 MED ORDER — DIPHENHYDRAMINE HCL 25 MG PO CAPS: 25.0000 mg | ORAL_CAPSULE | Freq: Four times a day (QID) | ORAL | Status: DC | PRN

## 2012-01-24 MED ORDER — ZOLPIDEM TARTRATE 5 MG PO TABS: 5.0000 mg | ORAL_TABLET | Freq: Every evening | ORAL | Status: DC | PRN

## 2012-01-24 MED ORDER — OXYTOCIN 10 UNIT/ML IJ SOLN
INTRAMUSCULAR | Status: AC
  Filled 2012-01-24: qty 4

## 2012-01-24 MED ORDER — NALBUPHINE HCL 10 MG/ML IJ SOLN
5.0000 mg | INTRAMUSCULAR | Status: DC | PRN
  Filled 2012-01-24: qty 1

## 2012-01-24 SURGICAL SUPPLY — 30 items
BENZOIN TINCTURE PRP APPL 2/3 (GAUZE/BANDAGES/DRESSINGS) ×2 IMPLANT
CLOTH BEACON ORANGE TIMEOUT ST (SAFETY) ×2 IMPLANT
CONTAINER PREFILL 10% NBF 15ML (MISCELLANEOUS) ×4 IMPLANT
DRAPE SURG 17X23 STRL (DRAPES) ×2 IMPLANT
DRSG COVADERM 4X10 (GAUZE/BANDAGES/DRESSINGS) ×2 IMPLANT
DURAPREP 26ML APPLICATOR (WOUND CARE) ×2 IMPLANT
ELECT REM PT RETURN 9FT ADLT (ELECTROSURGICAL) ×2
ELECTRODE REM PT RTRN 9FT ADLT (ELECTROSURGICAL) ×1 IMPLANT
EXTRACTOR VACUUM KIWI (MISCELLANEOUS) ×2 IMPLANT
GLOVE BIO SURGEON STRL SZ 6.5 (GLOVE) ×4 IMPLANT
GOWN PREVENTION PLUS LG XLONG (DISPOSABLE) ×4 IMPLANT
KIT ABG SYR 3ML LUER SLIP (SYRINGE) IMPLANT
NEEDLE HYPO 25X5/8 SAFETYGLIDE (NEEDLE) ×2 IMPLANT
NS IRRIG 1000ML POUR BTL (IV SOLUTION) ×2 IMPLANT
PACK C SECTION WH (CUSTOM PROCEDURE TRAY) ×2 IMPLANT
PAD OB MATERNITY 4.3X12.25 (PERSONAL CARE ITEMS) IMPLANT
RTRCTR C-SECT PINK 25CM LRG (MISCELLANEOUS) ×2 IMPLANT
SLEEVE SCD COMPRESS KNEE MED (MISCELLANEOUS) ×2 IMPLANT
STAPLER VISISTAT 35W (STAPLE) IMPLANT
SUT CHROMIC 1 CTX 36 (SUTURE) ×4 IMPLANT
SUT PLAIN 0 NONE (SUTURE) ×2 IMPLANT
SUT PLAIN 2 0 XLH (SUTURE) IMPLANT
SUT VIC AB 0 CT1 27 (SUTURE) ×2
SUT VIC AB 0 CT1 27XBRD ANBCTR (SUTURE) ×2 IMPLANT
SUT VIC AB 2-0 CT1 27 (SUTURE) ×1
SUT VIC AB 2-0 CT1 TAPERPNT 27 (SUTURE) ×1 IMPLANT
SUT VIC AB 4-0 KS 27 (SUTURE) IMPLANT
TOWEL OR 17X24 6PK STRL BLUE (TOWEL DISPOSABLE) ×4 IMPLANT
TRAY FOLEY CATH 14FR (SET/KITS/TRAYS/PACK) ×2 IMPLANT
WATER STERILE IRR 1000ML POUR (IV SOLUTION) ×2 IMPLANT

## 2012-01-24 NOTE — Addendum Note (Signed)
Addendum  created 01/24/12 1754 by Renford Dills, CRNA   Modules edited:Notes Section

## 2012-01-24 NOTE — Progress Notes (Signed)
Patient ID: [name], female   DOB: 23-Jun-1975, 36 y.o.   MRN: [id number] Per pt no change in dictated H&P, brief exam WNL.  Ready to proceed.

## 2012-01-24 NOTE — Anesthesia Procedure Notes (Signed)
Spinal  Patient location during procedure: OR Start time: 01/24/2012 9:54 AM Staffing Anesthesiologist: Brayton Caves R Performed by: anesthesiologist  Preanesthetic Checklist Completed: patient identified, site marked, surgical consent, pre-op evaluation, timeout performed, IV checked, risks and benefits discussed and monitors and equipment checked Spinal Block Patient position: sitting Prep: DuraPrep Patient monitoring: heart rate, cardiac monitor, continuous pulse ox and blood pressure Approach: midline Location: L3-4 Injection technique: single-shot Needle Needle type: Sprotte  Needle gauge: 24 G Needle length: 9 cm Assessment Sensory level: T4 Additional Notes Patient identified.  Risk benefits discussed including failed block, incomplete pain control, headache, nerve damage, paralysis, blood pressure changes, nausea, vomiting, reactions to medication both toxic or allergic, and postpartum back pain.  Patient expressed understanding and wished to proceed.  All questions were answered.  Sterile technique used throughout procedure.  CSF was clear.  No parasthesia or other complications.  Please see nursing notes for vital signs.

## 2012-01-24 NOTE — Anesthesia Postprocedure Evaluation (Signed)
Anesthesia Post Note  Patient: [name]  Procedure(s) Performed: Procedure(s) (LRB): CESAREAN SECTION WITH BILATERAL TUBAL LIGATION (Bilateral)  Anesthesia type: Spinal  Patient location: PACU  Post pain: Pain level controlled  Post assessment: Post-op Vital signs reviewed  Last Vitals:  Filed Vitals:   01/24/12 1145  BP: 98/57  Pulse: 61  Temp:   Resp: 20    Post vital signs: Reviewed  Level of consciousness: awake  Complications: No apparent anesthesia complications

## 2012-01-24 NOTE — Anesthesia Preprocedure Evaluation (Signed)
Anesthesia Evaluation  Patient identified by MRN, date of birth, ID band Patient awake    Reviewed: Allergy & Precautions, H&P , NPO status , Patient's Chart, lab work & pertinent test results  Airway Mallampati: II      Dental No notable dental hx.    Pulmonary neg pulmonary ROS,  breath sounds clear to auscultation  Pulmonary exam normal       Cardiovascular Exercise Tolerance: Good negative cardio ROS  Rhythm:regular Rate:Normal     Neuro/Psych PSYCHIATRIC DISORDERS Anxiety Depression negative neurological ROS  negative psych ROS   GI/Hepatic negative GI ROS, Neg liver ROS, GERD-  Controlled,  Endo/Other  negative endocrine ROS  Renal/GU negative Renal ROS  negative genitourinary   Musculoskeletal   Abdominal Normal abdominal exam  (+)   Peds  Hematology negative hematology ROS (+)   Anesthesia Other Findings GERD (gastroesophageal reflux disease)     Anxiety   no meds    Depression   no meds Tuberculosis 2002 tested positive but no active tb, neg test xray    Infertility, female    Reproductive/Obstetrics (+) Pregnancy                           Anesthesia Physical Anesthesia Plan  ASA: II  Anesthesia Plan: Spinal   Post-op Pain Management:    Induction:   Airway Management Planned:   Additional Equipment:   Intra-op Plan:   Post-operative Plan:   Informed Consent: I have reviewed the patients History and Physical, chart, labs and discussed the procedure including the risks, benefits and alternatives for the proposed anesthesia with the patient or authorized representative who has indicated his/her understanding and acceptance.     Plan Discussed with: Anesthesiologist, CRNA and Surgeon  Anesthesia Plan Comments:         Anesthesia Quick Evaluation

## 2012-01-24 NOTE — Anesthesia Postprocedure Evaluation (Signed)
  Anesthesia Post-op Note  Patient: [name]  Procedure(s) Performed: Procedure(s) (LRB) with comments: CESAREAN SECTION WITH BILATERAL TUBAL LIGATION (Bilateral) - Repeat  Patient Location: Mother/Baby  Anesthesia Type: Spinal  Level of Consciousness: awake  Airway and Oxygen Therapy: Patient Spontanous Breathing  Post-op Pain: mild  Post-op Assessment: Patient's Cardiovascular Status Stable and Respiratory Function Stable  Post-op Vital Signs: stable  Complications: No apparent anesthesia complications

## 2012-01-24 NOTE — Transfer of Care (Signed)
Immediate Anesthesia Transfer of Care Note  Patient: [name]  Procedure(s) Performed: Procedure(s) (LRB) with comments: CESAREAN SECTION WITH BILATERAL TUBAL LIGATION (Bilateral) - Repeat  Patient Location: PACU  Anesthesia Type: Spinal  Level of Consciousness: awake  Airway & Oxygen Therapy: Patient Spontanous Breathing  Post-op Assessment: Report given to PACU RN  Post vital signs: Reviewed and stable  Complications: No apparent anesthesia complications

## 2012-01-24 NOTE — Brief Op Note (Signed)
01/24/2012  11:07 AM  PATIENT:  Kurtis Bushman  36 y.o. female  PRE-OPERATIVE DIAGNOSIS:  previous c/s, desires sterility  POST-OPERATIVE DIAGNOSIS:  previous c-section, desires sterilization  PROCEDURE:  Procedure(s) (LRB) with comments: CESAREAN SECTION WITH BILATERAL TUBAL LIGATION (Bilateral) - Repeat  SURGEON:  Surgeon(s) and Role:    * Oliver Pila, MD - Primary    * Bing Plume, MD   ANESTHESIA:   spinal  EBL:  Total I/O In: 3800 [I.V.:3800] Out: 900 [Urine:100; Blood:800]  BLOOD ADMINISTERED:none  DRAINS: Urinary Catheter (Foley)   LOCAL MEDICATIONS USED:  NONE  SPECIMEN:  Placenta and bilateral tubal segments  DISPOSITION OF SPECIMEN:  PATHOLOGY and L&D  COUNTS:  YES  TOURNIQUET:  * No tourniquets in log *  DICTATION: .Dragon Dictation  PLAN OF CARE: Admit to inpatient   PATIENT DISPOSITION:  PACU - hemodynamically stable.

## 2012-01-24 NOTE — Op Note (Signed)
Operative note  Preoperative diagnosis Term pregnancy at 39 weeks and 4 days Prior C-section x2 Desires permanent sterility  Postoperative diagnosis Same  Procedure Repeat low transverse cesarean section and bilateral tubal ligation  Surgeon Dr. Huel Cote  Assistant Dr. Tracey Harries  Anesthesia Spinal  Fluids EBL 800 cc Urine output 200 cc clear urine IV fluids 2200 cc LR  Findings There is a viable female infant in the vertex presentation Apgars were 8 and 9. Weight was pending at the time of dictation. Patient had normal uterus ovaries and tubes noted.  Specimen Placenta sent to L&D after cord blood donation Bilateral tubal segments sent to pathology  Procedure After informed consent was obtained patient was taken to the operating room where spinal anesthesia was obtained without difficulty by Dr. Brayton Caves. Patient was then prepped and draped in the normal sterile fashion in the dorsal supine position with a leftward tilt a Foley catheter was in place. An appropriate time out was performed. A Pfannenstiel skin incision was then made through pre-existing scar and carried through to the underlying layer of fascia by sharp dissection and Bovie cautery. The fascia was then nicked in the midline and the incision was extended laterally with Mayo scissors. The inferior aspect of the fashion was grasped with Coker clamps and dissected off the underlying rectus muscles. The superior aspect of the fashion was dissected off the rectus muscles in a similar fashion with Bovie cautery. The peritoneal cavity was then entered sharply and the incision extended superiorly and inferiorly with careful attention to avoid both bowel and bladder. The Alexis self-retaining wound retractor was placed in the incision. The bladder was noted to be slightly adherent and was carefully pushed away from the anterior uterine fundus. The uterus was then incised in a transverse fashion and the cavity  entered bluntly. The infant's head was then elevated to the level of the incision and did require vacuum assistance to deliver through the incision. Nose and mouth were bulb suctioned and the remainder of the infant delivered without difficulty with the cord clamped and cut and handed to the waiting pediatricians. The placenta was then delivered intact and handed off for cord blood donation. The uterus was slightly boggy and responded well to bimanual massage and IV Pitocin. The uterine incision was then closed in 2 layers the first a running locked layer 1-0 chromic and the second an imbricating layer of the same suture. Attention was then turned to the left fallopian tube which was grasped a Babcock clamp traced out to its fimbriated end and a opening made in the mesial salpinx with Bovie cautery. A 2-3 cm segment of tube was then tied off with 2 free ties of 2 plain suture and the segment then amputated and handed off to pathology. The free pedicle was also additionally cauterized with Bovie cautery. In an identical fashion the right tube was elevated with a Babcock clamp a 2-3 cm segment tied off with 2 free ties of plain suture and amputated and handed off to pathology. This was also cauterized and the free pedicle with Bovie cautery. Both tubes were hemostatic and returned to the abdomen the incision was inspected and found to be hemostatic therefore the Alexis retractor was removed from the abdomen the rectus muscles and peritoneum were reapproximated with 2-0 plain in several interrupted mattress sutures. The fascia was closed with 0 Vicryl in a running fashion. The subcutaneous tissue was reapproximated with 2-0 plain in a running fashion. The skin was closed  with 4-0 Vicryl in a subcuticular stitch on a Keith needle. The incision was then reinforced with benzoin and Steri-Strips. All instruments and sponge counts were correct and the patient was taken to the recovery room in good condition.

## 2012-01-25 LAB — CBC
HCT: 31.5 % — ABNORMAL LOW (ref 36.0–46.0)
Hemoglobin: 10.3 g/dL — ABNORMAL LOW (ref 12.0–15.0)
MCV: 94.3 fL (ref 78.0–100.0)
RBC: 3.34 MIL/uL — ABNORMAL LOW (ref 3.87–5.11)
RDW: 13.9 % (ref 11.5–15.5)
WBC: 12.9 10*3/uL — ABNORMAL HIGH (ref 4.0–10.5)

## 2012-01-25 NOTE — Progress Notes (Signed)
SW received consult for hx of Anx/Dep.  SW reviewed MOB's PNR, which does not indicate a hx of these diagnoses anywhere.  SW sees where it is briefly documented in admission H&P.  SW contacted bedside RN to see if there are any concerns.  RN states no concerns.  SW asked that she contact SW if concerns arise or if MOB is interested in talking with SW, but does not plan to complete assessment at this time. 

## 2012-01-25 NOTE — Progress Notes (Signed)
Subjective: Postpartum Day 1 Cesarean Delivery Patient reports tolerating PO and no problems voiding.    Objective: Vital signs in last 24 hours: Temp:  [97.5 F (36.4 C)-99.6 F (37.6 C)] 98.6 F (37 C) (10/10 0544) Pulse Rate:  [55-84] 60  (10/10 0544) Resp:  [14-20] 16  (10/10 0544) BP: (87-116)/(45-74) 106/60 mmHg (10/10 0544) SpO2:  [96 %-100 %] 97 % (10/10 0544) Weight:  [74.844 kg (165 lb)] 74.844 kg (165 lb) (10/09 1110)  Physical Exam:  General: alert and cooperative Lochia: appropriate Uterine Fundus: firm Incision: C/D/I    Basename 01/25/12 0608  HGB 10.3*  HCT 31.5*    Assessment/Plan: Status post Cesarean section. Doing well postoperatively.  Continue current care.  Oliver Pila 01/25/2012, 8:58 AM

## 2012-01-26 MED ORDER — IBUPROFEN 600 MG PO TABS: 600.0000 mg | ORAL_TABLET | Freq: Four times a day (QID) | ORAL | Status: DC

## 2012-01-26 MED ORDER — OXYCODONE-ACETAMINOPHEN 5-325 MG PO TABS: 1.0000 | ORAL_TABLET | ORAL | Status: DC | PRN

## 2012-01-26 NOTE — Progress Notes (Signed)
Subjective: Postpartum Day 2 Cesarean Delivery Patient reports tolerating PO and no problems voiding.    Objective: Vital signs in last 24 hours: Temp:  [98.2 F (36.8 C)-98.5 F (36.9 C)] 98.5 F (36.9 C) (10/11 0520) Pulse Rate:  [65-89] 76  (10/11 0520) Resp:  [18-20] 18  (10/11 0520) BP: (107-122)/(64-81) 122/76 mmHg (10/11 0520) SpO2:  [99 %] 99 % (10/10 1000)  Physical Exam:  General: alert and cooperative Lochia: appropriate Uterine Fundus: firm Incision: healing well, no erythema   Basename 01/25/12 0608  HGB 10.3*  HCT 31.5*    Assessment/Plan: Status post Cesarean section. Doing well postoperatively.  Discharge home with standard precautions and return to clinic in 1-2 weeks for incision check.  Oliver Pila 01/26/2012, 8:09 AM

## 2012-01-26 NOTE — Discharge Summary (Signed)
Obstetric Discharge Summary Reason for Admission: cesarean section Prenatal Procedures: none Intrapartum Procedures: cesarean: low cervical, transverse Postpartum Procedures: none Complications-Operative and Postpartum: none Hemoglobin  Date Value Range Status  01/25/2012 10.3* 12.0 - 15.0 g/dL Final     HCT  Date Value Range Status  01/25/2012 31.5* 36.0 - 46.0 % Final    Physical Exam:  General: alert and cooperative Lochia: appropriate Uterine Fundus: firm Incision: healing well, no erythema   Discharge Diagnoses: Term Pregnancy-delivered  Discharge Information: Date: 01/26/2012 Activity: pelvic rest Diet: routine Medications: Ibuprofen and Percocet Condition: improved Instructions: refer to practice specific booklet Discharge to: home Follow-up Information    Follow up with Oliver Pila, MD. Schedule an appointment as soon as possible for a visit in 2 weeks.   Contact information:   [address], [address] [place] [address] [phone]          Newborn Data: Live born female  Birth Weight: 9 lb 10.5 oz (4380 g) APGAR: 9, 9  Home with mother.  Davida Falconi W 01/26/2012, 8:16 AM

## 2014-02-16 ENCOUNTER — Encounter (HOSPITAL_COMMUNITY): Payer: Self-pay | Admitting: *Deleted

## 2014-12-02 ENCOUNTER — Encounter: Payer: Self-pay | Admitting: Family Medicine

## 2014-12-02 ENCOUNTER — Ambulatory Visit (INDEPENDENT_AMBULATORY_CARE_PROVIDER_SITE_OTHER): Payer: 59 | Admitting: Family Medicine

## 2014-12-02 VITALS — BP 118/82 | HR 64 | Ht 63.0 in | Wt 150.0 lb

## 2014-12-02 DIAGNOSIS — M25552 Pain in left hip: Secondary | ICD-10-CM | POA: Diagnosis not present

## 2014-12-02 NOTE — Patient Instructions (Signed)
You have IT band syndrome Avoid painful activities as much as possible. Ok to start walk: jog as we discussed as long as pain is < 3 /10 level and not limping - I would focus on the rehab for 2 weeks before going ahead with this. Cross train with cycling, swimming, possibly elliptical. Ice or heat over area of pain 3-4 times a day for 15 minutes at a time Hip side raise, standing hip rotation exercises 3 sets of 10-15 once a day - add weights if these become too easy. Stretches - pick 2-3 and hold for 20-30 seconds x 3 - do once or twice a day. Tylenol and/or aleve as needed for pain. If not improving, can consider physical therapy and/or steroid injection. Follow up with me in 6 weeks but call if you're struggling before that.

## 2014-12-07 NOTE — Progress Notes (Signed)
PCP: No primary care provider on file.  Subjective:   HPI: Patient is a 39 y.o. female here for left hip pain.  Patient reports for about a week she's had lateral left hip pain. No known injury. Running about 3-4 times a week recently though just started this back up. 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 No swelling, bruising. Worsened at about 1.5 mile mark during a jog on Friday. Ok with walking. Feels like hip is out of joint sometimes. Worse lying on left side. No radiation. No numbness/tingling. No prior injuries though had some left hip pain in college.  Past Medical History  Diagnosis Date  . GERD (gastroesophageal reflux disease)   . Anxiety     no meds  . Depression     no meds  . Tuberculosis 2002    tested positive but no active tb, neg test xray  . Infertility, female     No current outpatient prescriptions on file prior to visit.   No current facility-administered medications on file prior to visit.    Past Surgical History  Procedure Laterality Date  . Wisdom tooth extraction    . Cesarean section  2008, 2009    x 2  . Excision of pilonidal cyst  1994    No Known Allergies  Social History   Social History  . Marital Status: Married    Spouse Name: N/A  . Number of Children: N/A  . Years of Education: N/A   Occupational History  . Not on file.   Social History Main Topics  . Smoking status: Never Smoker   . Smokeless tobacco: Never Used  . Alcohol Use: No  . Drug Use: No  . Sexual Activity: Yes    Birth Control/ Protection: None     Comment: pregnant   Other Topics Concern  . Not on file   Social History Narrative    No family history on file.  BP 118/82 mmHg  Pulse 64  Ht  (1.6 m)  Wt 150 lb (68.04 kg)  BMI 26.58 kg/m2  Review of Systems: See HPI above.    Objective:  Physical Exam:  Gen: NAD  Left hip/back: No gross deformity, scoliosis. TTP mild greater trochanter, proximal IT band .  No midline or  bony TTP. FROM with 4/5 strength hip abduction, mild pain. Strength LEs 5/5 all other muscle groups.   2+ MSRs in patellar and achilles tendons, equal bilaterally. Negative SLRs. Sensation intact to light touch bilaterally. Negative logroll bilateral hips Negative fabers and piriformis stretches. Negative hop test    Assessment & Plan:  1. Left hip pain - consistent with IT band syndrome.  Discussed home exercises and stretches to do daily.  Ice/heat, cross training.  Discussed parameters to allow for running.  Consider physical therapy, injection if not improving.  F/u in 6 weeks.

## 2014-12-07 NOTE — Assessment & Plan Note (Signed)
consistent with IT band syndrome.  Discussed home exercises and stretches to do daily.  Ice/heat, cross training.  Discussed parameters to allow for running.  Consider physical therapy, injection if not improving.  F/u in 6 weeks.

## 2015-01-27 ENCOUNTER — Ambulatory Visit
Admission: RE | Admit: 2015-01-27 | Discharge: 2015-01-27 | Disposition: A | Discharge status: Home / Self Care | Payer: 59 | Source: Ambulatory Visit | Attending: Family Medicine | Admitting: Family Medicine

## 2015-01-27 ENCOUNTER — Other Ambulatory Visit: Payer: Self-pay | Admitting: Family Medicine

## 2015-01-27 DIAGNOSIS — R609 Edema, unspecified: Secondary | ICD-10-CM

## 2015-01-27 DIAGNOSIS — T1490XA Injury, unspecified, initial encounter: Secondary | ICD-10-CM

## 2015-01-27 DIAGNOSIS — R52 Pain, unspecified: Secondary | ICD-10-CM

## 2015-06-09 DIAGNOSIS — L293 Anogenital pruritus, unspecified: Secondary | ICD-10-CM | POA: Diagnosis not present

## 2015-06-09 DIAGNOSIS — N760 Acute vaginitis: Secondary | ICD-10-CM | POA: Diagnosis not present

## 2015-06-09 DIAGNOSIS — B373 Candidiasis of vulva and vagina: Secondary | ICD-10-CM | POA: Diagnosis not present

## 2015-06-09 MED FILL — FLUCONAZOLE 150 MG TABLET: 150 | 4 days supply | Qty: 2 | Fill #0

## 2015-06-09 MED FILL — metroNIDAZOLE 500 MG TABS: 500 | 7 days supply | Qty: 14 | Fill #0

## 2015-08-19 DIAGNOSIS — Z1389 Encounter for screening for other disorder: Secondary | ICD-10-CM | POA: Diagnosis not present

## 2015-08-19 DIAGNOSIS — Z1151 Encounter for screening for human papillomavirus (HPV): Secondary | ICD-10-CM | POA: Diagnosis not present

## 2015-08-19 DIAGNOSIS — Z6828 Body mass index (BMI) 28.0-28.9, adult: Secondary | ICD-10-CM | POA: Diagnosis not present

## 2015-08-19 DIAGNOSIS — Z124 Encounter for screening for malignant neoplasm of cervix: Secondary | ICD-10-CM | POA: Diagnosis not present

## 2015-08-19 DIAGNOSIS — Z01419 Encounter for gynecological examination (general) (routine) without abnormal findings: Secondary | ICD-10-CM | POA: Diagnosis not present

## 2015-08-19 DIAGNOSIS — Z130 Encounter for screening for diseases of the blood and blood-forming organs and certain disorders involving the immune mechanism: Secondary | ICD-10-CM | POA: Diagnosis not present

## 2015-08-19 DIAGNOSIS — N898 Other specified noninflammatory disorders of vagina: Secondary | ICD-10-CM | POA: Diagnosis not present

## 2015-08-19 MED FILL — metroNIDAZOLE 500 MG TABS: 500 | 7 days supply | Qty: 14 | Fill #0

## 2015-08-23 DIAGNOSIS — Z124 Encounter for screening for malignant neoplasm of cervix: Secondary | ICD-10-CM | POA: Diagnosis not present

## 2015-08-24 DIAGNOSIS — Z1231 Encounter for screening mammogram for malignant neoplasm of breast: Secondary | ICD-10-CM | POA: Diagnosis not present

## 2015-12-10 ENCOUNTER — Encounter: Payer: 59 | Admitting: Internal Medicine

## 2015-12-17 ENCOUNTER — Ambulatory Visit (INDEPENDENT_AMBULATORY_CARE_PROVIDER_SITE_OTHER): Payer: 59 | Admitting: Internal Medicine

## 2015-12-17 DIAGNOSIS — Z9189 Other specified personal risk factors, not elsewhere classified: Secondary | ICD-10-CM

## 2015-12-17 DIAGNOSIS — Z789 Other specified health status: Secondary | ICD-10-CM | POA: Diagnosis not present

## 2015-12-17 DIAGNOSIS — Z7189 Other specified counseling: Secondary | ICD-10-CM | POA: Diagnosis not present

## 2015-12-17 DIAGNOSIS — Z23 Encounter for immunization: Secondary | ICD-10-CM | POA: Diagnosis not present

## 2015-12-17 DIAGNOSIS — IMO0002 Reserved for concepts with insufficient information to code with codable children: Secondary | ICD-10-CM

## 2015-12-17 MED ORDER — AZITHROMYCIN 500 MG PO TABS: 500.0000 mg | ORAL_TABLET | Freq: Every day | ORAL | 0 refills | Status: DC

## 2015-12-17 MED FILL — AZITHROMYCIN 500 MG TABLET: 500 | 6 days supply | Qty: 6 | Fill #0

## 2015-12-17 NOTE — Progress Notes (Signed)
    Patient ID: Ann BushmanJennifer L [name], female   DOB: 1975-07-21, 40 y.o.   MRN: [id number]  HPI Ann ShamesLauren is a 40yo F  who works at [employer] who will be taking her children to [place] from sep 7 thru the 13th.  She reports volunteering for the [employer] when she was younger and received all necessary vaccines at that time.   Outpatient Encounter Prescriptions as of 12/17/2015  Medication Sig  . cholecalciferol (VITAMIN D) 400 UNITS TABS tablet Take 400 Units by mouth.  . escitalopram (LEXAPRO) 10 MG tablet Take 10 mg by mouth daily.   No facility-administered encounter medications on file as of 12/17/2015.      Assessment and Plan  Pre travel vaccination = will give typhoid injection. Will need to ask if she received tdap at her last pregnancy 3-6963yrs AGO  Mosquito bite prevention = gave advice to using premethrin and deet  Traveler's diarrhea = gave rx for azithromycin to use if needed

## 2016-04-06 DIAGNOSIS — Z0000 Encounter for general adult medical examination without abnormal findings: Secondary | ICD-10-CM | POA: Diagnosis not present

## 2016-04-11 DIAGNOSIS — Z0000 Encounter for general adult medical examination without abnormal findings: Secondary | ICD-10-CM | POA: Diagnosis not present

## 2016-05-09 DIAGNOSIS — K219 Gastro-esophageal reflux disease without esophagitis: Secondary | ICD-10-CM | POA: Diagnosis not present

## 2016-05-09 DIAGNOSIS — R946 Abnormal results of thyroid function studies: Secondary | ICD-10-CM | POA: Diagnosis not present

## 2016-05-16 DIAGNOSIS — R5383 Other fatigue: Secondary | ICD-10-CM | POA: Diagnosis not present

## 2016-05-16 DIAGNOSIS — R946 Abnormal results of thyroid function studies: Secondary | ICD-10-CM | POA: Diagnosis not present

## 2016-05-16 DIAGNOSIS — Z6826 Body mass index (BMI) 26.0-26.9, adult: Secondary | ICD-10-CM | POA: Diagnosis not present

## 2016-05-16 DIAGNOSIS — K219 Gastro-esophageal reflux disease without esophagitis: Secondary | ICD-10-CM | POA: Diagnosis not present

## 2016-07-07 DIAGNOSIS — E559 Vitamin D deficiency, unspecified: Secondary | ICD-10-CM | POA: Diagnosis not present

## 2016-07-11 DIAGNOSIS — R5383 Other fatigue: Secondary | ICD-10-CM | POA: Diagnosis not present

## 2016-07-11 DIAGNOSIS — Z6829 Body mass index (BMI) 29.0-29.9, adult: Secondary | ICD-10-CM | POA: Diagnosis not present

## 2016-07-11 DIAGNOSIS — E559 Vitamin D deficiency, unspecified: Secondary | ICD-10-CM | POA: Diagnosis not present

## 2016-07-11 DIAGNOSIS — F411 Generalized anxiety disorder: Secondary | ICD-10-CM | POA: Diagnosis not present

## 2016-07-11 MED FILL — ESCITALOPRAM 10 MG TABLET: 10 | 90 days supply | Qty: 90 | Fill #0

## 2016-08-22 DIAGNOSIS — F329 Major depressive disorder, single episode, unspecified: Secondary | ICD-10-CM | POA: Diagnosis not present

## 2016-08-22 DIAGNOSIS — E559 Vitamin D deficiency, unspecified: Secondary | ICD-10-CM | POA: Diagnosis not present

## 2016-08-22 DIAGNOSIS — F419 Anxiety disorder, unspecified: Secondary | ICD-10-CM | POA: Diagnosis not present

## 2016-08-22 DIAGNOSIS — E663 Overweight: Secondary | ICD-10-CM | POA: Diagnosis not present

## 2016-10-16 MED FILL — ESCITALOPRAM 10 MG TABLET: 10 | 90 days supply | Qty: 90 | Fill #1

## 2016-12-18 DIAGNOSIS — F4323 Adjustment disorder with mixed anxiety and depressed mood: Secondary | ICD-10-CM | POA: Diagnosis not present

## 2017-01-01 DIAGNOSIS — F4323 Adjustment disorder with mixed anxiety and depressed mood: Secondary | ICD-10-CM | POA: Diagnosis not present

## 2017-01-02 DIAGNOSIS — M84374A Stress fracture, right foot, initial encounter for fracture: Secondary | ICD-10-CM | POA: Diagnosis not present

## 2017-02-02 DIAGNOSIS — F4323 Adjustment disorder with mixed anxiety and depressed mood: Secondary | ICD-10-CM | POA: Diagnosis not present

## 2017-02-16 DIAGNOSIS — F4323 Adjustment disorder with mixed anxiety and depressed mood: Secondary | ICD-10-CM | POA: Diagnosis not present

## 2017-03-23 DIAGNOSIS — F4323 Adjustment disorder with mixed anxiety and depressed mood: Secondary | ICD-10-CM | POA: Diagnosis not present

## 2017-04-12 IMAGING — CR DG FINGER THUMB 2+V*L*
1 series · 1 of 1 positions shown · non-contrast
Comparison: None in PACs

CLINICAL DATA: Injury of the left thumb today while exercising,
hyperextension type injury with pain and swelling at the MCP joint.

EXAM:
LEFT THUMB 2+V

[view not recorded]
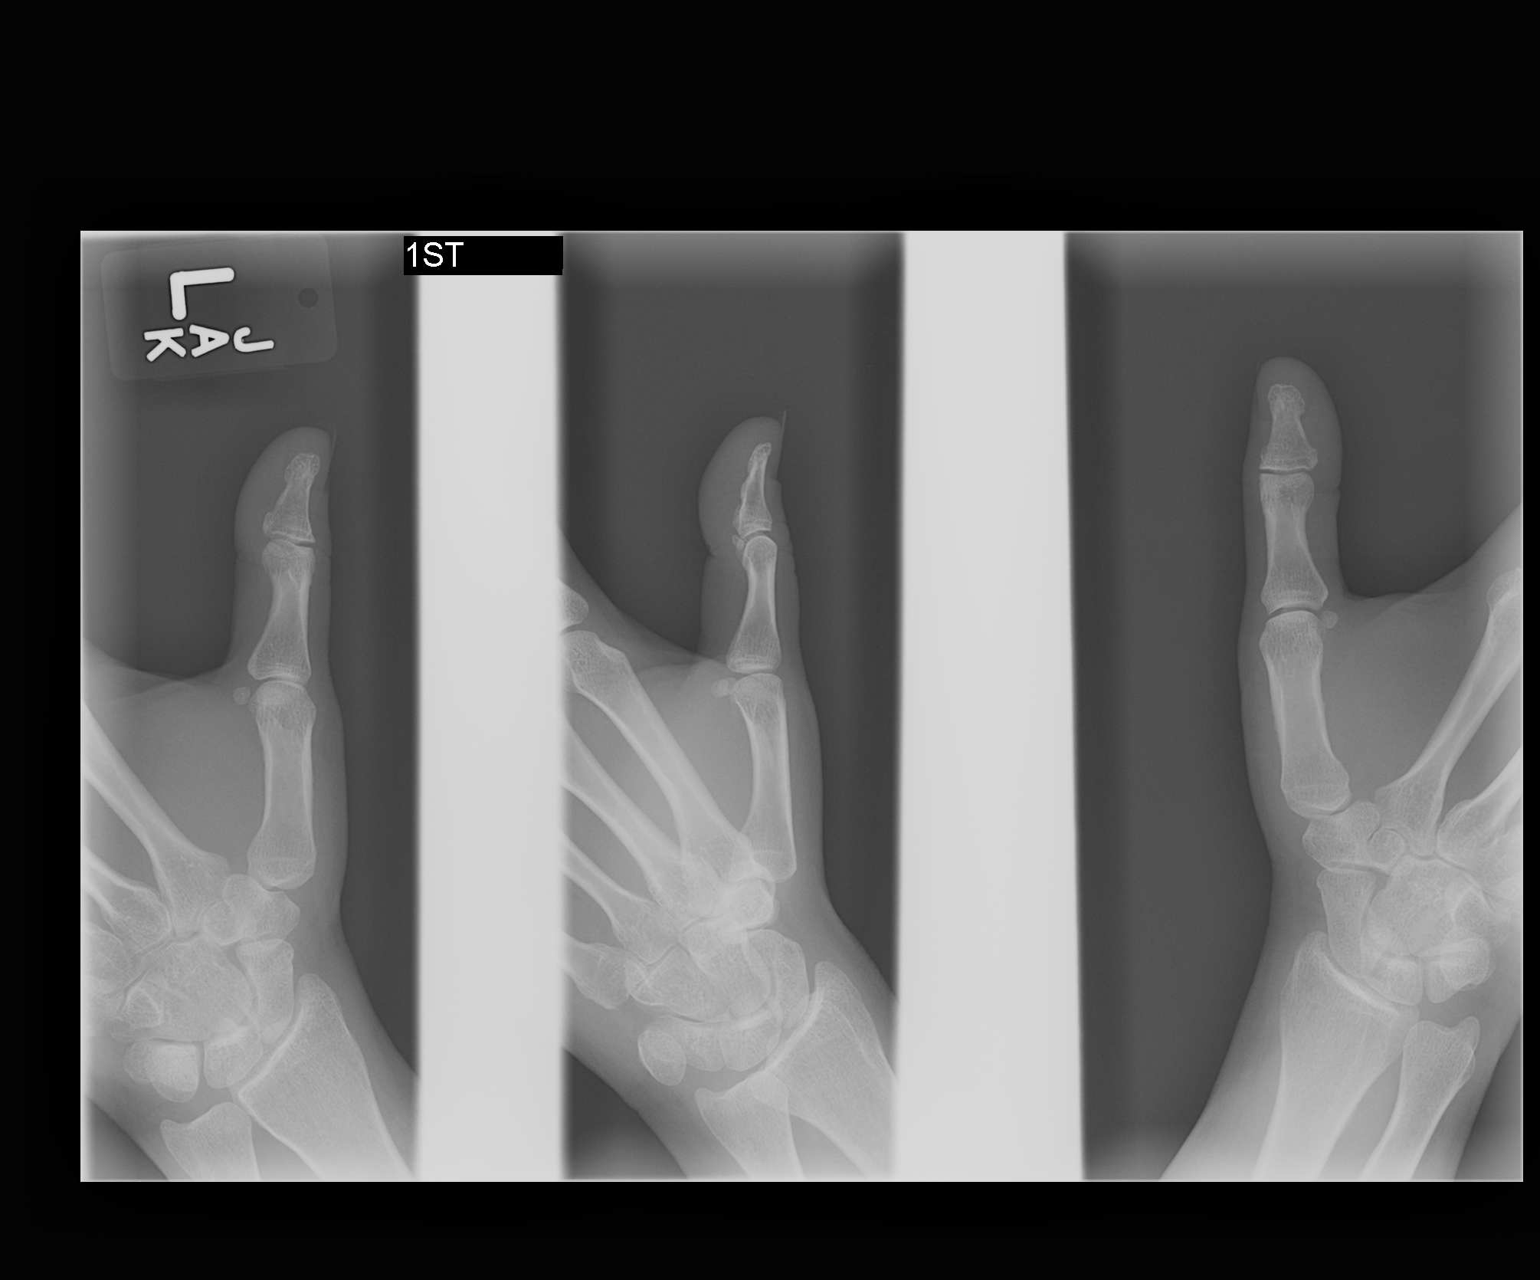

[1 of 1 positions shown; findings below may reference images not displayed]

FINDINGS: The bones of the thumb are adequately mineralized. There is no acute
fracture nor dislocation. The joint spaces are preserved. The soft
tissues are unremarkable.
IMPRESSION: There is no acute fracture nor dislocation of the left thumb.
Specific attention to the first metacarpophalangeal joint reveals no
acute abnormality.

## 2017-04-20 ENCOUNTER — Encounter: Payer: Self-pay | Admitting: Family Medicine

## 2017-04-20 ENCOUNTER — Ambulatory Visit (INDEPENDENT_AMBULATORY_CARE_PROVIDER_SITE_OTHER): Payer: 59 | Admitting: Family Medicine

## 2017-04-20 DIAGNOSIS — M25551 Pain in right hip: Secondary | ICD-10-CM

## 2017-04-20 MED ORDER — METHYLPREDNISOLONE ACETATE 40 MG/ML IJ SUSP
40.0000 mg | Freq: Once | INTRAMUSCULAR | Status: AC
  Administered 2017-04-20: 40 mg via INTRA_ARTICULAR

## 2017-04-20 NOTE — Patient Instructions (Signed)
You have trochanteric bursitis, hip external rotator weakness/strain, and proximal IT band syndrome - these are very commonly associated with each other. You were given an injection today. Avoid painful activities as much as possible. Ice over area of pain 3-4 times a day for 15 minutes at a time Standing hip rotations and hip side raise exercise 3 sets of 10 once a day - add weights if these become too easy. Stretches - pick 2-3 and hold for 20-30 seconds x 3 - do once or twice a day. Ibuprofen 600mg  three times a day with food for pain and inflammation while you're waiting for the cortisone to kick in. Consider physical therapy if you're struggling Follow up with me in 1 month (or as needed if you're doing well).

## 2017-04-24 DIAGNOSIS — Z0000 Encounter for general adult medical examination without abnormal findings: Secondary | ICD-10-CM | POA: Diagnosis not present

## 2017-04-24 DIAGNOSIS — E559 Vitamin D deficiency, unspecified: Secondary | ICD-10-CM | POA: Diagnosis not present

## 2017-04-25 ENCOUNTER — Encounter: Payer: Self-pay | Admitting: Family Medicine

## 2017-04-25 DIAGNOSIS — M25551 Pain in right hip: Secondary | ICD-10-CM | POA: Insufficient documentation

## 2017-04-25 DIAGNOSIS — F32A Depression, unspecified: Secondary | ICD-10-CM | POA: Insufficient documentation

## 2017-04-25 DIAGNOSIS — N912 Amenorrhea, unspecified: Secondary | ICD-10-CM | POA: Insufficient documentation

## 2017-04-25 DIAGNOSIS — F419 Anxiety disorder, unspecified: Secondary | ICD-10-CM | POA: Insufficient documentation

## 2017-04-25 NOTE — Assessment & Plan Note (Signed)
2/2 combination of trochanteric bursitis, hip external rotator weakness/strain, proximal IT band syndrome.  Bursal injection given today.  Icing, shown home exercises and stretches.  Ibuprofen if needed.  Consider physical therapy if struggling.  F/u in 1 month.  After informed written consent timeout was performed and patient was lying on left side on exam table.  Area overlying right greater trochanteric bursa prepped with alcohol swab then right greater trochanteric bursa injected with 6:2 bupivicaine: depomedrol.  Patient tolerated procedure well without immediate complications.

## 2017-04-25 NOTE — Progress Notes (Signed)
PCP: Lewis Moccasin, MD  Subjective:   HPI: Patient is a 42 y.o. female here for right hip pain.  Patient reports she's had throbbing lateral right hip for over a week now. No acute injury or trauma. No popping or clicking. Taking ibuprofen 800mg  when needed. Pain level is 0/10 at rest but up to 4/10 at times and sharp. Has not been icing or doing any exercises for this. Worse when lying on right side. No bowel/bladder dysfunction. No numbness or tingling.  Past Medical History:  Diagnosis Date  . Anxiety    no meds  . Depression    no meds  . GERD (gastroesophageal reflux disease)   . Infertility, female   . Tuberculosis 2002   tested positive but no active tb, neg test xray    Current Outpatient Medications on File Prior to Visit  Medication Sig Dispense Refill  . ALPRAZolam (XANAX) 0.5 MG tablet alprazolam 0.5 mg tablet    . azithromycin (ZITHROMAX) 500 MG tablet Take 1 tablet (500 mg total) by mouth daily. X 3 days if you have 3+ loose stools/24hr. Can stop taking if diarrhea resolves 6 tablet 0  . chlorhexidine (PERIDEX) 0.12 % solution chlorhexidine gluconate 0.12 % mouthwash    . cholecalciferol (VITAMIN D) 400 UNITS TABS tablet Take 400 Units by mouth.    . escitalopram (LEXAPRO) 10 MG tablet Take 10 mg by mouth daily.    Marland Kitchen esomeprazole (NEXIUM) 40 MG capsule Nexium 40 mg capsule,delayed release    . norethindrone-ethinyl estradiol-iron (LARIN FE 1.5/30) 1.5-30 MG-MCG tablet Larin Fe 1.5/30 (28) 1.5 mg-30 mcg (21)/75 mg (7) tablet    . Prenatal Vit-DSS-Fe Fum-FA (PRENATAL 19) tablet Prenatal Plus (calcium carbonate) 27 mg iron-1 mg tablet     No current facility-administered medications on file prior to visit.     Past Surgical History:  Procedure Laterality Date  . CESAREAN SECTION  2008, 2009   x 2  . excision of pilonidal cyst  1994  . WISDOM TOOTH EXTRACTION      No Known Allergies  Social History   Socioeconomic History  . Marital status:  Married    Spouse name: Not on file  . Number of children: Not on file  . Years of education: Not on file  . Highest education level: Not on file  Social Needs  . Financial resource strain: Not on file  . Food insecurity - worry: Not on file  . Food insecurity - inability: Not on file  . Transportation needs - medical: Not on file  . Transportation needs - non-medical: Not on file  Occupational History  . Not on file  Tobacco Use  . Smoking status: Never Smoker  . Smokeless tobacco: Never Used  Substance and Sexual Activity  . Alcohol use: No    Alcohol/week: 0.0 oz  . Drug use: No  . Sexual activity: Yes    Birth control/protection: None    Comment: pregnant  Other Topics Concern  . Not on file  Social History Narrative  . Not on file    History reviewed. No pertinent family history.  BP 108/77   Pulse 81   Ht 5\' 3"  (1.6 m)   Wt 170 lb (77.1 kg)   BMI 30.11 kg/m   Review of Systems: See HPI above.     Objective:  Physical Exam:  Gen: NAD, comfortable in exam room  Back: No gross deformity, scoliosis. No TTP.  No midline or bony TTP. FROM without pain. Strength  LEs 5/5 all muscle groups except 4/5 with right hip abduction.   2+ MSRs in patellar and achilles tendons, equal bilaterally. Negative SLRs. Sensation intact to light touch bilaterally.  Right hip: No deformity. TTP proximal IT band, trochanteric bursa, less external rotators. FROM with mild pain posteriorly with full IR. Hip abduction 4/5. No gross deformity. Negative logroll Negative fabers and piriformis stretches.   Assessment & Plan:  1. Right hip pain - 2/2 combination of trochanteric bursitis, hip external rotator weakness/strain, proximal IT band syndrome.  Bursal injection given today.  Icing, shown home exercises and stretches.  Ibuprofen if needed.  Consider physical therapy if struggling.  F/u in 1 month.  After informed written consent timeout was performed and patient was lying  on left side on exam table.  Area overlying right greater trochanteric bursa prepped with alcohol swab then right greater trochanteric bursa injected with 6:2 bupivicaine: depomedrol.  Patient tolerated procedure well without immediate complications.

## 2017-05-01 DIAGNOSIS — F329 Major depressive disorder, single episode, unspecified: Secondary | ICD-10-CM | POA: Diagnosis not present

## 2017-05-01 DIAGNOSIS — F419 Anxiety disorder, unspecified: Secondary | ICD-10-CM | POA: Diagnosis not present

## 2017-05-01 DIAGNOSIS — Z6829 Body mass index (BMI) 29.0-29.9, adult: Secondary | ICD-10-CM | POA: Diagnosis not present

## 2017-05-01 DIAGNOSIS — Z0000 Encounter for general adult medical examination without abnormal findings: Secondary | ICD-10-CM | POA: Diagnosis not present

## 2017-05-10 DIAGNOSIS — R946 Abnormal results of thyroid function studies: Secondary | ICD-10-CM | POA: Diagnosis not present

## 2017-05-10 MED FILL — ESCITALOPRAM 20 MG TABLET: 20 | 30 days supply | Qty: 30 | Fill #0

## 2017-05-22 ENCOUNTER — Ambulatory Visit: Payer: 59 | Admitting: Family Medicine

## 2017-05-29 ENCOUNTER — Ambulatory Visit: Payer: 59 | Admitting: Family Medicine

## 2017-05-29 DIAGNOSIS — Z6830 Body mass index (BMI) 30.0-30.9, adult: Secondary | ICD-10-CM | POA: Diagnosis not present

## 2017-05-29 DIAGNOSIS — F329 Major depressive disorder, single episode, unspecified: Secondary | ICD-10-CM | POA: Diagnosis not present

## 2017-05-29 DIAGNOSIS — F411 Generalized anxiety disorder: Secondary | ICD-10-CM | POA: Diagnosis not present

## 2017-05-29 MED FILL — LEVOTHYROXINE 25 MCG TABLET: 25 | 90 days supply | Qty: 90 | Fill #0

## 2017-06-25 MED FILL — ESCITALOPRAM 20 MG TABLET: 20 | 90 days supply | Qty: 90 | Fill #0

## 2017-08-14 DIAGNOSIS — N951 Menopausal and female climacteric states: Secondary | ICD-10-CM | POA: Diagnosis not present

## 2017-08-14 DIAGNOSIS — R635 Abnormal weight gain: Secondary | ICD-10-CM | POA: Diagnosis not present

## 2017-08-16 DIAGNOSIS — R6882 Decreased libido: Secondary | ICD-10-CM | POA: Diagnosis not present

## 2017-08-16 DIAGNOSIS — E7800 Pure hypercholesterolemia, unspecified: Secondary | ICD-10-CM | POA: Diagnosis not present

## 2017-08-16 DIAGNOSIS — E669 Obesity, unspecified: Secondary | ICD-10-CM | POA: Diagnosis not present

## 2017-08-16 DIAGNOSIS — R7303 Prediabetes: Secondary | ICD-10-CM | POA: Diagnosis not present

## 2017-08-16 DIAGNOSIS — Z1339 Encounter for screening examination for other mental health and behavioral disorders: Secondary | ICD-10-CM | POA: Diagnosis not present

## 2017-08-16 DIAGNOSIS — G479 Sleep disorder, unspecified: Secondary | ICD-10-CM | POA: Diagnosis not present

## 2017-08-16 DIAGNOSIS — Z1331 Encounter for screening for depression: Secondary | ICD-10-CM | POA: Diagnosis not present

## 2017-08-16 DIAGNOSIS — R5383 Other fatigue: Secondary | ICD-10-CM | POA: Diagnosis not present

## 2017-08-23 DIAGNOSIS — R7303 Prediabetes: Secondary | ICD-10-CM | POA: Diagnosis not present

## 2017-08-23 DIAGNOSIS — Z713 Dietary counseling and surveillance: Secondary | ICD-10-CM | POA: Diagnosis not present

## 2017-08-23 DIAGNOSIS — E7800 Pure hypercholesterolemia, unspecified: Secondary | ICD-10-CM | POA: Diagnosis not present

## 2017-08-23 DIAGNOSIS — E669 Obesity, unspecified: Secondary | ICD-10-CM | POA: Diagnosis not present

## 2017-08-31 DIAGNOSIS — R7303 Prediabetes: Secondary | ICD-10-CM | POA: Diagnosis not present

## 2017-08-31 DIAGNOSIS — E7800 Pure hypercholesterolemia, unspecified: Secondary | ICD-10-CM | POA: Diagnosis not present

## 2017-08-31 DIAGNOSIS — E669 Obesity, unspecified: Secondary | ICD-10-CM | POA: Diagnosis not present

## 2017-08-31 DIAGNOSIS — Z713 Dietary counseling and surveillance: Secondary | ICD-10-CM | POA: Diagnosis not present

## 2017-09-07 DIAGNOSIS — E7800 Pure hypercholesterolemia, unspecified: Secondary | ICD-10-CM | POA: Diagnosis not present

## 2017-09-07 DIAGNOSIS — Z713 Dietary counseling and surveillance: Secondary | ICD-10-CM | POA: Diagnosis not present

## 2017-09-07 DIAGNOSIS — R7303 Prediabetes: Secondary | ICD-10-CM | POA: Diagnosis not present

## 2017-09-07 DIAGNOSIS — E669 Obesity, unspecified: Secondary | ICD-10-CM | POA: Diagnosis not present

## 2017-09-17 DIAGNOSIS — E669 Obesity, unspecified: Secondary | ICD-10-CM | POA: Diagnosis not present

## 2017-09-17 DIAGNOSIS — Z713 Dietary counseling and surveillance: Secondary | ICD-10-CM | POA: Diagnosis not present

## 2017-09-17 DIAGNOSIS — R7303 Prediabetes: Secondary | ICD-10-CM | POA: Diagnosis not present

## 2017-09-24 DIAGNOSIS — Z713 Dietary counseling and surveillance: Secondary | ICD-10-CM | POA: Diagnosis not present

## 2017-09-24 DIAGNOSIS — E669 Obesity, unspecified: Secondary | ICD-10-CM | POA: Diagnosis not present

## 2017-09-24 DIAGNOSIS — E7800 Pure hypercholesterolemia, unspecified: Secondary | ICD-10-CM | POA: Diagnosis not present

## 2018-05-08 MED FILL — FLUCONAZOLE 150 MG TABS: 150 | 4 days supply | Qty: 2 | Fill #0

## 2018-05-13 DIAGNOSIS — G43909 Migraine, unspecified, not intractable, without status migrainosus: Secondary | ICD-10-CM | POA: Diagnosis not present

## 2018-05-13 DIAGNOSIS — F329 Major depressive disorder, single episode, unspecified: Secondary | ICD-10-CM | POA: Diagnosis not present

## 2018-05-13 DIAGNOSIS — Z0000 Encounter for general adult medical examination without abnormal findings: Secondary | ICD-10-CM | POA: Diagnosis not present

## 2018-05-13 DIAGNOSIS — E559 Vitamin D deficiency, unspecified: Secondary | ICD-10-CM | POA: Diagnosis not present

## 2018-05-13 DIAGNOSIS — Z6829 Body mass index (BMI) 29.0-29.9, adult: Secondary | ICD-10-CM | POA: Diagnosis not present

## 2018-05-13 DIAGNOSIS — E079 Disorder of thyroid, unspecified: Secondary | ICD-10-CM | POA: Diagnosis not present

## 2018-05-13 DIAGNOSIS — R946 Abnormal results of thyroid function studies: Secondary | ICD-10-CM | POA: Diagnosis not present

## 2018-05-13 DIAGNOSIS — F419 Anxiety disorder, unspecified: Secondary | ICD-10-CM | POA: Diagnosis not present

## 2018-05-15 MED FILL — LEVOTHYROXINE 25 MCG TABLET: 25 | 90 days supply | Qty: 90 | Fill #0

## 2018-05-17 DIAGNOSIS — Z0000 Encounter for general adult medical examination without abnormal findings: Secondary | ICD-10-CM | POA: Diagnosis not present

## 2018-05-17 DIAGNOSIS — Z6829 Body mass index (BMI) 29.0-29.9, adult: Secondary | ICD-10-CM | POA: Diagnosis not present

## 2018-05-31 DIAGNOSIS — Z1151 Encounter for screening for human papillomavirus (HPV): Secondary | ICD-10-CM | POA: Diagnosis not present

## 2018-05-31 DIAGNOSIS — Z124 Encounter for screening for malignant neoplasm of cervix: Secondary | ICD-10-CM | POA: Diagnosis not present

## 2018-05-31 DIAGNOSIS — Z130 Encounter for screening for diseases of the blood and blood-forming organs and certain disorders involving the immune mechanism: Secondary | ICD-10-CM | POA: Diagnosis not present

## 2018-05-31 DIAGNOSIS — Z6830 Body mass index (BMI) 30.0-30.9, adult: Secondary | ICD-10-CM | POA: Diagnosis not present

## 2018-05-31 DIAGNOSIS — Z1389 Encounter for screening for other disorder: Secondary | ICD-10-CM | POA: Diagnosis not present

## 2018-05-31 DIAGNOSIS — Z01419 Encounter for gynecological examination (general) (routine) without abnormal findings: Secondary | ICD-10-CM | POA: Diagnosis not present

## 2018-05-31 DIAGNOSIS — Z1231 Encounter for screening mammogram for malignant neoplasm of breast: Secondary | ICD-10-CM | POA: Diagnosis not present

## 2018-05-31 DIAGNOSIS — N760 Acute vaginitis: Secondary | ICD-10-CM | POA: Diagnosis not present

## 2018-05-31 DIAGNOSIS — L293 Anogenital pruritus, unspecified: Secondary | ICD-10-CM | POA: Diagnosis not present

## 2018-05-31 DIAGNOSIS — N926 Irregular menstruation, unspecified: Secondary | ICD-10-CM | POA: Diagnosis not present

## 2018-05-31 MED FILL — metroNIDAZOLE 500 MG TABS: 500 | 7 days supply | Qty: 14 | Fill #0

## 2018-06-06 DIAGNOSIS — I83813 Varicose veins of bilateral lower extremities with pain: Secondary | ICD-10-CM | POA: Diagnosis not present

## 2018-07-04 DIAGNOSIS — T8189XA Other complications of procedures, not elsewhere classified, initial encounter: Secondary | ICD-10-CM | POA: Diagnosis not present

## 2018-07-04 DIAGNOSIS — Z3202 Encounter for pregnancy test, result negative: Secondary | ICD-10-CM | POA: Diagnosis not present

## 2018-07-04 DIAGNOSIS — Z30430 Encounter for insertion of intrauterine contraceptive device: Secondary | ICD-10-CM | POA: Diagnosis not present

## 2018-07-04 DIAGNOSIS — N920 Excessive and frequent menstruation with regular cycle: Secondary | ICD-10-CM | POA: Diagnosis not present

## 2018-07-04 MED FILL — LARISSIA 0.1-20 MG-MCG TABS: 0.1-20 | 84 days supply | Qty: 84 | Fill #0

## 2018-07-08 DIAGNOSIS — F329 Major depressive disorder, single episode, unspecified: Secondary | ICD-10-CM | POA: Diagnosis not present

## 2018-07-08 DIAGNOSIS — F419 Anxiety disorder, unspecified: Secondary | ICD-10-CM | POA: Diagnosis not present

## 2018-07-08 DIAGNOSIS — E559 Vitamin D deficiency, unspecified: Secondary | ICD-10-CM | POA: Diagnosis not present

## 2018-07-08 DIAGNOSIS — E039 Hypothyroidism, unspecified: Secondary | ICD-10-CM | POA: Diagnosis not present

## 2018-07-08 MED FILL — buPROPion HCL ER (XL) 150 M: 150 | 90 days supply | Qty: 90 | Fill #0

## 2018-07-09 DIAGNOSIS — E039 Hypothyroidism, unspecified: Secondary | ICD-10-CM | POA: Diagnosis not present

## 2018-07-09 DIAGNOSIS — R739 Hyperglycemia, unspecified: Secondary | ICD-10-CM | POA: Diagnosis not present

## 2018-07-09 DIAGNOSIS — E559 Vitamin D deficiency, unspecified: Secondary | ICD-10-CM | POA: Diagnosis not present

## 2018-07-12 MED FILL — TERCONAZOLE 0.8% VAGINAL CR: 0.8 | 7 days supply | Qty: 20 | Fill #0

## 2018-07-12 MED FILL — LEVOTHYROXINE 50 MCG TABLET: 50 | 90 days supply | Qty: 90 | Fill #0

## 2018-09-02 DIAGNOSIS — I83813 Varicose veins of bilateral lower extremities with pain: Secondary | ICD-10-CM | POA: Diagnosis not present

## 2018-09-12 DIAGNOSIS — I8311 Varicose veins of right lower extremity with inflammation: Secondary | ICD-10-CM | POA: Diagnosis not present

## 2018-09-12 DIAGNOSIS — I8312 Varicose veins of left lower extremity with inflammation: Secondary | ICD-10-CM | POA: Diagnosis not present

## 2018-09-12 DIAGNOSIS — I83813 Varicose veins of bilateral lower extremities with pain: Secondary | ICD-10-CM | POA: Diagnosis not present

## 2018-09-30 DIAGNOSIS — L293 Anogenital pruritus, unspecified: Secondary | ICD-10-CM | POA: Diagnosis not present

## 2018-09-30 DIAGNOSIS — Z309 Encounter for contraceptive management, unspecified: Secondary | ICD-10-CM | POA: Diagnosis not present

## 2018-09-30 DIAGNOSIS — Z3041 Encounter for surveillance of contraceptive pills: Secondary | ICD-10-CM | POA: Diagnosis not present

## 2018-10-02 MED FILL — LARISSIA 0.1-20 MG-MCG TABS: 0.1-20 | 84 days supply | Qty: 84 | Fill #0

## 2018-10-04 DIAGNOSIS — E039 Hypothyroidism, unspecified: Secondary | ICD-10-CM | POA: Diagnosis not present

## 2018-10-07 DIAGNOSIS — F329 Major depressive disorder, single episode, unspecified: Secondary | ICD-10-CM | POA: Diagnosis not present

## 2018-10-07 DIAGNOSIS — E039 Hypothyroidism, unspecified: Secondary | ICD-10-CM | POA: Diagnosis not present

## 2018-10-07 DIAGNOSIS — F419 Anxiety disorder, unspecified: Secondary | ICD-10-CM | POA: Diagnosis not present

## 2018-10-07 MED FILL — SERTRALINE HCL 25 MG TABLET: 25 | 45 days supply | Qty: 90 | Fill #0

## 2018-10-07 MED FILL — LEVOTHYROXINE 50 MCG TABLET: 50 | 90 days supply | Qty: 90 | Fill #0

## 2018-10-16 ENCOUNTER — Encounter: Payer: Self-pay | Admitting: *Deleted

## 2018-11-14 DIAGNOSIS — I8311 Varicose veins of right lower extremity with inflammation: Secondary | ICD-10-CM | POA: Diagnosis not present

## 2018-11-14 DIAGNOSIS — I83811 Varicose veins of right lower extremities with pain: Secondary | ICD-10-CM | POA: Diagnosis not present

## 2018-11-18 DIAGNOSIS — I8311 Varicose veins of right lower extremity with inflammation: Secondary | ICD-10-CM | POA: Diagnosis not present

## 2018-11-18 DIAGNOSIS — I83811 Varicose veins of right lower extremities with pain: Secondary | ICD-10-CM | POA: Diagnosis not present

## 2018-11-21 DIAGNOSIS — I8311 Varicose veins of right lower extremity with inflammation: Secondary | ICD-10-CM | POA: Diagnosis not present

## 2018-11-21 DIAGNOSIS — I83811 Varicose veins of right lower extremities with pain: Secondary | ICD-10-CM | POA: Diagnosis not present

## 2018-12-05 DIAGNOSIS — I8311 Varicose veins of right lower extremity with inflammation: Secondary | ICD-10-CM | POA: Diagnosis not present

## 2018-12-05 DIAGNOSIS — M7981 Nontraumatic hematoma of soft tissue: Secondary | ICD-10-CM | POA: Diagnosis not present

## 2018-12-05 DIAGNOSIS — I83811 Varicose veins of right lower extremities with pain: Secondary | ICD-10-CM | POA: Diagnosis not present

## 2018-12-19 DIAGNOSIS — I8311 Varicose veins of right lower extremity with inflammation: Secondary | ICD-10-CM | POA: Diagnosis not present

## 2018-12-19 DIAGNOSIS — I83811 Varicose veins of right lower extremities with pain: Secondary | ICD-10-CM | POA: Diagnosis not present

## 2019-02-10 MED FILL — LEVOTHYROXINE 50 MCG TABLET: 50 | 90 days supply | Qty: 90 | Fill #1

## 2019-06-02 DIAGNOSIS — Z1389 Encounter for screening for other disorder: Secondary | ICD-10-CM | POA: Diagnosis not present

## 2019-06-02 DIAGNOSIS — N920 Excessive and frequent menstruation with regular cycle: Secondary | ICD-10-CM | POA: Diagnosis not present

## 2019-06-02 DIAGNOSIS — Z6831 Body mass index (BMI) 31.0-31.9, adult: Secondary | ICD-10-CM | POA: Diagnosis not present

## 2019-06-02 DIAGNOSIS — Z01419 Encounter for gynecological examination (general) (routine) without abnormal findings: Secondary | ICD-10-CM | POA: Diagnosis not present

## 2019-06-02 DIAGNOSIS — Z130 Encounter for screening for diseases of the blood and blood-forming organs and certain disorders involving the immune mechanism: Secondary | ICD-10-CM | POA: Diagnosis not present

## 2019-06-05 DIAGNOSIS — Z1231 Encounter for screening mammogram for malignant neoplasm of breast: Secondary | ICD-10-CM | POA: Diagnosis not present

## 2019-06-05 MED FILL — FLUCONAZOLE 150 MG TABS: 150 | 35 days supply | Qty: 10 | Fill #0

## 2019-08-13 DIAGNOSIS — Z131 Encounter for screening for diabetes mellitus: Secondary | ICD-10-CM | POA: Diagnosis not present

## 2019-08-13 DIAGNOSIS — Z803 Family history of malignant neoplasm of breast: Secondary | ICD-10-CM | POA: Diagnosis not present

## 2019-08-13 DIAGNOSIS — Z13220 Encounter for screening for lipoid disorders: Secondary | ICD-10-CM | POA: Diagnosis not present

## 2019-08-13 DIAGNOSIS — N920 Excessive and frequent menstruation with regular cycle: Secondary | ICD-10-CM | POA: Diagnosis not present

## 2019-08-13 DIAGNOSIS — Z0000 Encounter for general adult medical examination without abnormal findings: Secondary | ICD-10-CM | POA: Diagnosis not present

## 2019-08-13 DIAGNOSIS — E039 Hypothyroidism, unspecified: Secondary | ICD-10-CM | POA: Diagnosis not present

## 2019-08-19 ENCOUNTER — Other Ambulatory Visit (HOSPITAL_COMMUNITY): Payer: Self-pay | Admitting: Nurse Practitioner

## 2019-08-19 MED FILL — LEVOTHYROXINE 75 MCG TABLET: 75 | 30 days supply | Qty: 30 | Fill #0

## 2019-10-07 DIAGNOSIS — E039 Hypothyroidism, unspecified: Secondary | ICD-10-CM | POA: Diagnosis not present

## 2019-10-08 MED FILL — LEVOTHYROXINE 75 MCG TABLET: 75 | 30 days supply | Qty: 30 | Fill #1

## 2020-01-28 MED FILL — LEVOTHYROXINE 75 MCG TABLET: 75 | 30 days supply | Qty: 30 | Fill #2

## 2020-02-03 DIAGNOSIS — Z20822 Contact with and (suspected) exposure to covid-19: Secondary | ICD-10-CM | POA: Diagnosis not present

## 2020-06-10 ENCOUNTER — Other Ambulatory Visit (HOSPITAL_COMMUNITY): Payer: Self-pay | Admitting: Nurse Practitioner

## 2020-06-10 MED FILL — LEVOTHYROXINE 75 MCG TABLET: 75 | 30 days supply | Qty: 30 | Fill #0

## 2020-06-22 DIAGNOSIS — Z6829 Body mass index (BMI) 29.0-29.9, adult: Secondary | ICD-10-CM | POA: Diagnosis not present

## 2020-06-22 DIAGNOSIS — Z130 Encounter for screening for diseases of the blood and blood-forming organs and certain disorders involving the immune mechanism: Secondary | ICD-10-CM | POA: Diagnosis not present

## 2020-06-22 DIAGNOSIS — Z1389 Encounter for screening for other disorder: Secondary | ICD-10-CM | POA: Diagnosis not present

## 2020-06-22 DIAGNOSIS — Z1231 Encounter for screening mammogram for malignant neoplasm of breast: Secondary | ICD-10-CM | POA: Diagnosis not present

## 2020-06-22 DIAGNOSIS — N921 Excessive and frequent menstruation with irregular cycle: Secondary | ICD-10-CM | POA: Diagnosis not present

## 2020-06-22 DIAGNOSIS — Z01419 Encounter for gynecological examination (general) (routine) without abnormal findings: Secondary | ICD-10-CM | POA: Diagnosis not present

## 2020-06-22 DIAGNOSIS — F520 Hypoactive sexual desire disorder: Secondary | ICD-10-CM | POA: Diagnosis not present

## 2020-06-25 ENCOUNTER — Other Ambulatory Visit (HOSPITAL_COMMUNITY): Payer: Self-pay | Admitting: Nurse Practitioner

## 2020-06-25 DIAGNOSIS — E039 Hypothyroidism, unspecified: Secondary | ICD-10-CM | POA: Diagnosis not present

## 2020-06-25 DIAGNOSIS — F411 Generalized anxiety disorder: Secondary | ICD-10-CM | POA: Diagnosis not present

## 2020-06-25 MED FILL — SERTRALINE HCL 50 MG TABS: 50 | 30 days supply | Qty: 30 | Fill #0

## 2020-06-30 ENCOUNTER — Other Ambulatory Visit (HOSPITAL_COMMUNITY): Payer: Self-pay | Admitting: Nurse Practitioner

## 2020-08-18 ENCOUNTER — Other Ambulatory Visit (HOSPITAL_COMMUNITY): Payer: Self-pay

## 2020-08-18 MED FILL — Sertraline HCl Tab 50 MG: ORAL | 30 days supply | Qty: 30 | Fill #0 | Status: AC

## 2020-08-18 MED FILL — Levothyroxine Sodium Tab 75 MCG: ORAL | 30 days supply | Qty: 30 | Fill #0 | Status: AC

## 2020-10-29 ENCOUNTER — Other Ambulatory Visit (HOSPITAL_COMMUNITY): Payer: Self-pay

## 2020-10-29 MED FILL — Sertraline HCl Tab 50 MG: ORAL | 30 days supply | Qty: 30 | Fill #1 | Status: AC

## 2020-10-29 MED FILL — Levothyroxine Sodium Tab 75 MCG: ORAL | 30 days supply | Qty: 30 | Fill #1 | Status: AC

## 2020-12-28 DIAGNOSIS — N920 Excessive and frequent menstruation with regular cycle: Secondary | ICD-10-CM | POA: Diagnosis not present

## 2021-01-07 ENCOUNTER — Other Ambulatory Visit (HOSPITAL_COMMUNITY): Payer: Self-pay

## 2021-01-07 MED ORDER — SERTRALINE HCL 50 MG PO TABS
ORAL_TABLET | ORAL | 2 refills | Status: DC
  Filled 2021-01-07: qty 30, 30d supply, fill #0
  Filled 2021-02-28: qty 30, 30d supply, fill #1

## 2021-01-10 ENCOUNTER — Other Ambulatory Visit (HOSPITAL_COMMUNITY): Payer: Self-pay

## 2021-01-10 MED ORDER — LEVOTHYROXINE SODIUM 75 MCG PO TABS
ORAL_TABLET | ORAL | 0 refills | Status: DC
  Filled 2021-01-10 – 2021-01-31 (×2): qty 30, 60d supply, fill #0

## 2021-01-11 ENCOUNTER — Other Ambulatory Visit (HOSPITAL_COMMUNITY): Payer: Self-pay

## 2021-01-18 ENCOUNTER — Other Ambulatory Visit (HOSPITAL_COMMUNITY): Payer: Self-pay

## 2021-01-31 ENCOUNTER — Other Ambulatory Visit (HOSPITAL_COMMUNITY): Payer: Self-pay

## 2021-02-02 ENCOUNTER — Other Ambulatory Visit (HOSPITAL_COMMUNITY): Payer: Self-pay

## 2021-02-03 ENCOUNTER — Other Ambulatory Visit (HOSPITAL_COMMUNITY): Payer: Self-pay

## 2021-02-08 ENCOUNTER — Other Ambulatory Visit (HOSPITAL_COMMUNITY): Payer: Self-pay

## 2021-02-08 MED ORDER — TRANEXAMIC ACID 650 MG PO TABS
ORAL_TABLET | ORAL | 1 refills | Status: DC
  Filled 2021-02-08: qty 30, 5d supply, fill #0

## 2021-02-21 ENCOUNTER — Other Ambulatory Visit (HOSPITAL_COMMUNITY): Payer: Self-pay

## 2021-02-21 MED ORDER — MISOPROSTOL 200 MCG PO TABS
ORAL_TABLET | ORAL | 0 refills | Status: DC
  Filled 2021-02-21: qty 2, 1d supply, fill #0

## 2021-02-28 ENCOUNTER — Other Ambulatory Visit (HOSPITAL_COMMUNITY): Payer: Self-pay

## 2021-03-01 DIAGNOSIS — Z30430 Encounter for insertion of intrauterine contraceptive device: Secondary | ICD-10-CM | POA: Diagnosis not present

## 2021-03-01 DIAGNOSIS — N920 Excessive and frequent menstruation with regular cycle: Secondary | ICD-10-CM | POA: Diagnosis not present

## 2021-04-21 DIAGNOSIS — Z30431 Encounter for routine checking of intrauterine contraceptive device: Secondary | ICD-10-CM | POA: Diagnosis not present

## 2021-07-11 DIAGNOSIS — E039 Hypothyroidism, unspecified: Secondary | ICD-10-CM | POA: Diagnosis not present

## 2021-07-11 DIAGNOSIS — Z0000 Encounter for general adult medical examination without abnormal findings: Secondary | ICD-10-CM | POA: Diagnosis not present

## 2021-07-11 DIAGNOSIS — Z13220 Encounter for screening for lipoid disorders: Secondary | ICD-10-CM | POA: Diagnosis not present

## 2021-07-14 ENCOUNTER — Other Ambulatory Visit (HOSPITAL_COMMUNITY): Payer: Self-pay

## 2021-07-14 DIAGNOSIS — F411 Generalized anxiety disorder: Secondary | ICD-10-CM | POA: Diagnosis not present

## 2021-07-14 DIAGNOSIS — Z1211 Encounter for screening for malignant neoplasm of colon: Secondary | ICD-10-CM | POA: Diagnosis not present

## 2021-07-14 DIAGNOSIS — Z1212 Encounter for screening for malignant neoplasm of rectum: Secondary | ICD-10-CM | POA: Diagnosis not present

## 2021-07-14 DIAGNOSIS — Z0000 Encounter for general adult medical examination without abnormal findings: Secondary | ICD-10-CM | POA: Diagnosis not present

## 2021-07-14 DIAGNOSIS — E039 Hypothyroidism, unspecified: Secondary | ICD-10-CM | POA: Diagnosis not present

## 2021-07-14 MED ORDER — SERTRALINE HCL 50 MG PO TABS
ORAL_TABLET | ORAL | 1 refills | Status: DC
  Filled 2021-07-14: qty 90, 90d supply, fill #0
  Filled 2021-11-23: qty 90, 90d supply, fill #1

## 2021-07-14 MED ORDER — LEVOTHYROXINE SODIUM 75 MCG PO TABS
ORAL_TABLET | ORAL | 3 refills | Status: DC
  Filled 2021-07-14: qty 45, 90d supply, fill #0
  Filled 2021-11-23: qty 45, 90d supply, fill #1
  Filled 2021-12-12: qty 45, 90d supply, fill #0

## 2021-08-03 DIAGNOSIS — Z1231 Encounter for screening mammogram for malignant neoplasm of breast: Secondary | ICD-10-CM | POA: Diagnosis not present

## 2021-08-03 DIAGNOSIS — Z30431 Encounter for routine checking of intrauterine contraceptive device: Secondary | ICD-10-CM | POA: Diagnosis not present

## 2021-08-03 DIAGNOSIS — Z6830 Body mass index (BMI) 30.0-30.9, adult: Secondary | ICD-10-CM | POA: Diagnosis not present

## 2021-08-03 DIAGNOSIS — Z01419 Encounter for gynecological examination (general) (routine) without abnormal findings: Secondary | ICD-10-CM | POA: Diagnosis not present

## 2021-08-03 DIAGNOSIS — Z130 Encounter for screening for diseases of the blood and blood-forming organs and certain disorders involving the immune mechanism: Secondary | ICD-10-CM | POA: Diagnosis not present

## 2021-08-03 DIAGNOSIS — Z1389 Encounter for screening for other disorder: Secondary | ICD-10-CM | POA: Diagnosis not present

## 2021-10-05 DIAGNOSIS — Z1211 Encounter for screening for malignant neoplasm of colon: Secondary | ICD-10-CM | POA: Diagnosis not present

## 2021-11-23 ENCOUNTER — Other Ambulatory Visit (HOSPITAL_COMMUNITY): Payer: Self-pay

## 2021-11-25 ENCOUNTER — Other Ambulatory Visit (HOSPITAL_COMMUNITY): Payer: Self-pay

## 2021-12-05 ENCOUNTER — Other Ambulatory Visit (HOSPITAL_COMMUNITY): Payer: Self-pay

## 2021-12-12 ENCOUNTER — Other Ambulatory Visit (HOSPITAL_COMMUNITY): Payer: Self-pay

## 2021-12-12 ENCOUNTER — Ambulatory Visit (INDEPENDENT_AMBULATORY_CARE_PROVIDER_SITE_OTHER): Payer: 59 | Admitting: Family Medicine

## 2021-12-12 VITALS — BP 144/97 | Ht 63.0 in | Wt 180.0 lb

## 2021-12-12 DIAGNOSIS — M25561 Pain in right knee: Secondary | ICD-10-CM | POA: Insufficient documentation

## 2021-12-12 MED ORDER — MELOXICAM 15 MG PO TABS
15.0000 mg | ORAL_TABLET | Freq: Every day | ORAL | 1 refills | Status: DC
  Filled 2021-12-12: qty 30, 30d supply, fill #0

## 2021-12-12 NOTE — Progress Notes (Unsigned)
    SUBJECTIVE:   CHIEF COMPLAINT / HPI:   Right knee pain [name] is a pleasant 46 year old female who presents today for gradually worsening right knee pain.  She reports slow change over the last year or so, but has noticed in the last 3 weeks difficulty with stairs and pain when getting up from sitting.  3 to 4 days ago she noticed her right knee was more swollen than the left and decided to come in for care.  She experiences posterior knee pain when standing but anterior knee pain with use such as walking or climbing stairs.  Denies knee redness or warmth.  No systemic symptoms such as fever or chills.  No catching or locking.  No known history of knee injuries or surgeries.  No previous diagnoses of arthritis or gout.  PERTINENT  PMH / PSH:  Patient Active Problem List   Diagnosis Date Noted   Pain in joint of right knee 12/12/2021   Amenorrhea 04/25/2017   Anxiety 04/25/2017   Right hip pain 04/25/2017   HIP PAIN, LEFT 11/15/2009    OBJECTIVE:   BP (!) 144/97   Ht 5\' 3"  (1.6 m)   Wt 180 lb (81.6 kg)   BMI 31.89 kg/m    Right knee Inspection reveals mild swelling to right knee without overlying ecchymoses Palpation unremarkable, no tenderness along the joint lines, patellar tendon, or quad tendon Full range of motion without pain Patella nontender to palpation, normal patellar tracking, no patellar crepitus, no apprehension No laxity with varus/valgus stress or anterior/posterior drawers McMurray negative Thessaly positive  ASSESSMENT/PLAN:   Pain in joint of right knee Concern for right meniscal tear versus early osteoarthritis.  We will order complete right knee x-rays including standing views.  Trial meloxicam 15 mg daily x30 days.  Discussed if this is meniscal tear it will likely heal within 4 to 6 weeks with conservative management.  Consider PT pending x-ray results.  Follow up in 6 weeks.   , MD

## 2021-12-12 NOTE — Patient Instructions (Signed)
Your pain is due to a medial mensicus tear vs arthritis. Get x-rays tomorrow morning when you can - we will call you with the results and likely recommend physical therapy as well. These are the different medications you can take for this: Tylenol 500mg  1-2 tabs three times a day for pain. Capsaicin, aspercreme, or biofreeze topically up to four times a day may also help with pain. Some supplements that may help for arthritis: Boswellia extract, curcumin, pycnogenol Meloxicam 15 mg daily with food for pain and inflammation. Cortisone injections are an option if pain is severe. It's important that you continue to stay active. Consider physical therapy to strengthen muscles around the joint that hurts to take pressure off of the joint itself. Shoe inserts with good arch support may be helpful. Heat or ice 15 minutes at a time 3-4 times a day as needed to help with pain.

## 2021-12-12 NOTE — Assessment & Plan Note (Signed)
Concern for right meniscal tear versus early osteoarthritis.  We will order complete right knee x-rays including standing views.  Trial meloxicam 15 mg daily x30 days.  Discussed if this is meniscal tear we will likely heal within 4 to 6 weeks with conservative management.  Consider PT exercises pending x-ray results.  Return as needed.

## 2021-12-13 ENCOUNTER — Other Ambulatory Visit (HOSPITAL_COMMUNITY): Payer: Self-pay

## 2021-12-13 ENCOUNTER — Ambulatory Visit
Admission: RE | Admit: 2021-12-13 | Discharge: 2021-12-13 | Disposition: A | Discharge status: Home / Self Care | Payer: 59 | Source: Ambulatory Visit | Attending: Family Medicine | Admitting: Family Medicine

## 2021-12-13 ENCOUNTER — Encounter: Payer: Self-pay | Admitting: Family Medicine

## 2021-12-13 DIAGNOSIS — M25561 Pain in right knee: Secondary | ICD-10-CM | POA: Diagnosis not present

## 2021-12-14 ENCOUNTER — Encounter: Payer: Self-pay | Admitting: Family Medicine

## 2021-12-15 NOTE — Addendum Note (Signed)
Addended by: Lillia Pauls C on: 12/15/2021 12:00 PM   Modules accepted: Orders

## 2021-12-26 DIAGNOSIS — M25561 Pain in right knee: Secondary | ICD-10-CM | POA: Diagnosis not present

## 2021-12-29 DIAGNOSIS — M25561 Pain in right knee: Secondary | ICD-10-CM | POA: Diagnosis not present

## 2022-01-03 DIAGNOSIS — M25561 Pain in right knee: Secondary | ICD-10-CM | POA: Diagnosis not present

## 2022-01-06 DIAGNOSIS — M25561 Pain in right knee: Secondary | ICD-10-CM | POA: Diagnosis not present

## 2022-01-10 DIAGNOSIS — M25561 Pain in right knee: Secondary | ICD-10-CM | POA: Diagnosis not present

## 2022-01-13 DIAGNOSIS — M25561 Pain in right knee: Secondary | ICD-10-CM | POA: Diagnosis not present

## 2022-01-20 DIAGNOSIS — M25561 Pain in right knee: Secondary | ICD-10-CM | POA: Diagnosis not present

## 2022-01-22 DIAGNOSIS — M25561 Pain in right knee: Secondary | ICD-10-CM | POA: Diagnosis not present

## 2022-01-26 DIAGNOSIS — M25561 Pain in right knee: Secondary | ICD-10-CM | POA: Diagnosis not present

## 2022-01-31 DIAGNOSIS — M25561 Pain in right knee: Secondary | ICD-10-CM | POA: Diagnosis not present

## 2022-02-13 DIAGNOSIS — M25561 Pain in right knee: Secondary | ICD-10-CM | POA: Diagnosis not present

## 2022-02-28 DIAGNOSIS — M25562 Pain in left knee: Secondary | ICD-10-CM | POA: Diagnosis not present

## 2022-02-28 DIAGNOSIS — M25561 Pain in right knee: Secondary | ICD-10-CM | POA: Diagnosis not present

## 2022-02-28 DIAGNOSIS — M25569 Pain in unspecified knee: Secondary | ICD-10-CM | POA: Diagnosis not present

## 2022-03-16 DIAGNOSIS — M25569 Pain in unspecified knee: Secondary | ICD-10-CM | POA: Diagnosis not present

## 2022-03-24 DIAGNOSIS — M25569 Pain in unspecified knee: Secondary | ICD-10-CM | POA: Diagnosis not present

## 2022-03-28 ENCOUNTER — Other Ambulatory Visit (HOSPITAL_COMMUNITY): Payer: Self-pay

## 2022-03-28 DIAGNOSIS — R030 Elevated blood-pressure reading, without diagnosis of hypertension: Secondary | ICD-10-CM | POA: Diagnosis not present

## 2022-03-28 DIAGNOSIS — Z131 Encounter for screening for diabetes mellitus: Secondary | ICD-10-CM | POA: Diagnosis not present

## 2022-03-28 DIAGNOSIS — E039 Hypothyroidism, unspecified: Secondary | ICD-10-CM | POA: Diagnosis not present

## 2022-03-28 DIAGNOSIS — E6609 Other obesity due to excess calories: Secondary | ICD-10-CM | POA: Diagnosis not present

## 2022-03-28 DIAGNOSIS — M79604 Pain in right leg: Secondary | ICD-10-CM | POA: Diagnosis not present

## 2022-03-28 DIAGNOSIS — M79671 Pain in right foot: Secondary | ICD-10-CM | POA: Diagnosis not present

## 2022-03-28 DIAGNOSIS — F411 Generalized anxiety disorder: Secondary | ICD-10-CM | POA: Diagnosis not present

## 2022-03-28 DIAGNOSIS — Z13220 Encounter for screening for lipoid disorders: Secondary | ICD-10-CM | POA: Diagnosis not present

## 2022-03-28 DIAGNOSIS — Z6833 Body mass index (BMI) 33.0-33.9, adult: Secondary | ICD-10-CM | POA: Diagnosis not present

## 2022-03-28 DIAGNOSIS — M79672 Pain in left foot: Secondary | ICD-10-CM | POA: Diagnosis not present

## 2022-03-28 DIAGNOSIS — M79605 Pain in left leg: Secondary | ICD-10-CM | POA: Diagnosis not present

## 2022-03-28 MED ORDER — CELECOXIB 200 MG PO CAPS
200.0000 mg | ORAL_CAPSULE | Freq: Every day | ORAL | 2 refills | Status: DC
  Filled 2022-03-28 – 2022-04-29 (×2): qty 30, 30d supply, fill #0

## 2022-03-31 ENCOUNTER — Other Ambulatory Visit (HOSPITAL_COMMUNITY): Payer: Self-pay

## 2022-03-31 DIAGNOSIS — E782 Mixed hyperlipidemia: Secondary | ICD-10-CM | POA: Diagnosis not present

## 2022-03-31 DIAGNOSIS — Z6833 Body mass index (BMI) 33.0-33.9, adult: Secondary | ICD-10-CM | POA: Diagnosis not present

## 2022-03-31 DIAGNOSIS — E039 Hypothyroidism, unspecified: Secondary | ICD-10-CM | POA: Diagnosis not present

## 2022-03-31 DIAGNOSIS — E6609 Other obesity due to excess calories: Secondary | ICD-10-CM | POA: Diagnosis not present

## 2022-03-31 MED ORDER — WEGOVY 0.5 MG/0.5ML ~~LOC~~ SOAJ
0.5000 mg | SUBCUTANEOUS | 0 refills | Status: DC
  Filled 2022-03-31 – 2022-04-29 (×2): qty 2, 28d supply, fill #0

## 2022-04-03 DIAGNOSIS — M25569 Pain in unspecified knee: Secondary | ICD-10-CM | POA: Diagnosis not present

## 2022-04-04 ENCOUNTER — Other Ambulatory Visit (HOSPITAL_COMMUNITY): Payer: Self-pay

## 2022-04-04 MED ORDER — WEGOVY 0.25 MG/0.5ML ~~LOC~~ SOAJ: SUBCUTANEOUS | 0 refills | Status: DC

## 2022-04-05 ENCOUNTER — Other Ambulatory Visit (HOSPITAL_COMMUNITY): Payer: Self-pay

## 2022-04-06 ENCOUNTER — Other Ambulatory Visit (HOSPITAL_COMMUNITY): Payer: Self-pay

## 2022-04-29 ENCOUNTER — Other Ambulatory Visit (HOSPITAL_COMMUNITY): Payer: Self-pay

## 2022-07-25 ENCOUNTER — Other Ambulatory Visit (HOSPITAL_COMMUNITY): Payer: Self-pay

## 2022-08-17 ENCOUNTER — Other Ambulatory Visit (HOSPITAL_COMMUNITY): Payer: Self-pay

## 2022-08-17 MED ORDER — SERTRALINE HCL 50 MG PO TABS
50.0000 mg | ORAL_TABLET | Freq: Every day | ORAL | 1 refills | Status: DC
  Filled 2022-08-17: qty 90, 90d supply, fill #0

## 2022-08-18 ENCOUNTER — Other Ambulatory Visit (HOSPITAL_COMMUNITY): Payer: Self-pay

## 2022-08-21 DIAGNOSIS — Z1231 Encounter for screening mammogram for malignant neoplasm of breast: Secondary | ICD-10-CM | POA: Diagnosis not present

## 2022-08-21 DIAGNOSIS — Z1389 Encounter for screening for other disorder: Secondary | ICD-10-CM | POA: Diagnosis not present

## 2022-08-21 DIAGNOSIS — R319 Hematuria, unspecified: Secondary | ICD-10-CM | POA: Diagnosis not present

## 2022-08-21 DIAGNOSIS — R635 Abnormal weight gain: Secondary | ICD-10-CM | POA: Diagnosis not present

## 2022-08-21 DIAGNOSIS — Z30431 Encounter for routine checking of intrauterine contraceptive device: Secondary | ICD-10-CM | POA: Diagnosis not present

## 2022-08-21 DIAGNOSIS — Z6834 Body mass index (BMI) 34.0-34.9, adult: Secondary | ICD-10-CM | POA: Diagnosis not present

## 2022-08-21 DIAGNOSIS — Z01419 Encounter for gynecological examination (general) (routine) without abnormal findings: Secondary | ICD-10-CM | POA: Diagnosis not present

## 2022-08-21 DIAGNOSIS — Z803 Family history of malignant neoplasm of breast: Secondary | ICD-10-CM | POA: Diagnosis not present

## 2022-08-22 ENCOUNTER — Other Ambulatory Visit (HOSPITAL_COMMUNITY): Payer: Self-pay

## 2022-08-22 MED ORDER — LEVOTHYROXINE SODIUM 25 MCG PO TABS
25.0000 ug | ORAL_TABLET | Freq: Every day | ORAL | 1 refills | Status: DC
  Filled 2022-08-22: qty 30, 30d supply, fill #0

## 2022-08-23 ENCOUNTER — Other Ambulatory Visit (HOSPITAL_COMMUNITY): Payer: Self-pay

## 2022-08-25 ENCOUNTER — Other Ambulatory Visit (HOSPITAL_COMMUNITY): Payer: Self-pay

## 2022-08-25 DIAGNOSIS — Z0000 Encounter for general adult medical examination without abnormal findings: Secondary | ICD-10-CM | POA: Diagnosis not present

## 2022-08-25 DIAGNOSIS — R600 Localized edema: Secondary | ICD-10-CM | POA: Diagnosis not present

## 2022-08-25 DIAGNOSIS — E6609 Other obesity due to excess calories: Secondary | ICD-10-CM | POA: Diagnosis not present

## 2022-08-25 DIAGNOSIS — E782 Mixed hyperlipidemia: Secondary | ICD-10-CM | POA: Diagnosis not present

## 2022-08-25 DIAGNOSIS — Z6833 Body mass index (BMI) 33.0-33.9, adult: Secondary | ICD-10-CM | POA: Diagnosis not present

## 2022-08-25 DIAGNOSIS — F411 Generalized anxiety disorder: Secondary | ICD-10-CM | POA: Diagnosis not present

## 2022-08-25 DIAGNOSIS — Z1331 Encounter for screening for depression: Secondary | ICD-10-CM | POA: Diagnosis not present

## 2022-08-25 DIAGNOSIS — E039 Hypothyroidism, unspecified: Secondary | ICD-10-CM | POA: Diagnosis not present

## 2022-08-25 MED ORDER — SPIRONOLACTONE 50 MG PO TABS
ORAL_TABLET | ORAL | 5 refills | Status: DC
  Filled 2022-08-25 – 2022-09-02 (×2): qty 30, 30d supply, fill #0
  Filled 2022-11-15: qty 30, 30d supply, fill #1
  Filled 2022-12-27: qty 30, 30d supply, fill #2

## 2022-08-25 MED ORDER — LEVOTHYROXINE SODIUM 75 MCG PO TABS
ORAL_TABLET | ORAL | 3 refills | Status: DC
  Filled 2022-08-25 – 2022-09-02 (×2): qty 90, 90d supply, fill #0

## 2022-09-02 ENCOUNTER — Other Ambulatory Visit (HOSPITAL_COMMUNITY): Payer: Self-pay

## 2022-09-07 DIAGNOSIS — F1190 Opioid use, unspecified, uncomplicated: Secondary | ICD-10-CM | POA: Diagnosis not present

## 2022-09-07 DIAGNOSIS — F419 Anxiety disorder, unspecified: Secondary | ICD-10-CM | POA: Diagnosis not present

## 2022-09-07 DIAGNOSIS — F332 Major depressive disorder, recurrent severe without psychotic features: Secondary | ICD-10-CM | POA: Diagnosis not present

## 2022-09-11 ENCOUNTER — Encounter (INDEPENDENT_AMBULATORY_CARE_PROVIDER_SITE_OTHER): Payer: Self-pay

## 2022-09-12 ENCOUNTER — Encounter (INDEPENDENT_AMBULATORY_CARE_PROVIDER_SITE_OTHER): Payer: Self-pay | Admitting: Family Medicine

## 2022-10-05 DIAGNOSIS — F332 Major depressive disorder, recurrent severe without psychotic features: Secondary | ICD-10-CM | POA: Diagnosis not present

## 2022-10-05 DIAGNOSIS — F419 Anxiety disorder, unspecified: Secondary | ICD-10-CM | POA: Diagnosis not present

## 2022-10-09 DIAGNOSIS — Z79899 Other long term (current) drug therapy: Secondary | ICD-10-CM | POA: Diagnosis not present

## 2022-10-09 DIAGNOSIS — E039 Hypothyroidism, unspecified: Secondary | ICD-10-CM | POA: Diagnosis not present

## 2022-10-10 ENCOUNTER — Other Ambulatory Visit (HOSPITAL_COMMUNITY): Payer: Self-pay

## 2022-10-10 MED ORDER — LEVOTHYROXINE SODIUM 100 MCG PO TABS
100.0000 ug | ORAL_TABLET | Freq: Every day | ORAL | 5 refills | Status: AC
Start: 2022-10-10 — End: ?
  Filled 2022-10-10: qty 30, 30d supply, fill #0

## 2022-10-20 ENCOUNTER — Other Ambulatory Visit (HOSPITAL_COMMUNITY): Payer: Self-pay

## 2022-11-01 ENCOUNTER — Encounter (INDEPENDENT_AMBULATORY_CARE_PROVIDER_SITE_OTHER): Payer: Self-pay | Admitting: Internal Medicine

## 2022-11-01 DIAGNOSIS — F419 Anxiety disorder, unspecified: Secondary | ICD-10-CM | POA: Diagnosis not present

## 2022-11-01 DIAGNOSIS — F332 Major depressive disorder, recurrent severe without psychotic features: Secondary | ICD-10-CM | POA: Diagnosis not present

## 2022-11-01 DIAGNOSIS — Z0289 Encounter for other administrative examinations: Secondary | ICD-10-CM

## 2022-11-14 ENCOUNTER — Other Ambulatory Visit (HOSPITAL_COMMUNITY): Payer: Self-pay

## 2022-11-14 DIAGNOSIS — F332 Major depressive disorder, recurrent severe without psychotic features: Secondary | ICD-10-CM | POA: Diagnosis not present

## 2022-11-14 DIAGNOSIS — F419 Anxiety disorder, unspecified: Secondary | ICD-10-CM | POA: Diagnosis not present

## 2022-11-14 MED ORDER — BUPROPION HCL ER (XL) 150 MG PO TB24
ORAL_TABLET | ORAL | 0 refills | Status: DC
  Filled 2022-11-14: qty 60, 37d supply, fill #0

## 2022-11-15 ENCOUNTER — Ambulatory Visit (INDEPENDENT_AMBULATORY_CARE_PROVIDER_SITE_OTHER): Payer: 59 | Admitting: Podiatry

## 2022-11-15 ENCOUNTER — Other Ambulatory Visit (HOSPITAL_COMMUNITY): Payer: Self-pay

## 2022-11-15 ENCOUNTER — Ambulatory Visit (INDEPENDENT_AMBULATORY_CARE_PROVIDER_SITE_OTHER): Payer: 59

## 2022-11-15 VITALS — BP 136/81

## 2022-11-15 DIAGNOSIS — M722 Plantar fascial fibromatosis: Secondary | ICD-10-CM | POA: Diagnosis not present

## 2022-11-15 DIAGNOSIS — M79672 Pain in left foot: Secondary | ICD-10-CM

## 2022-11-15 MED ORDER — DICLOFENAC SODIUM 75 MG PO TBEC
75.0000 mg | DELAYED_RELEASE_TABLET | Freq: Two times a day (BID) | ORAL | 0 refills | Status: DC
  Filled 2022-11-15: qty 30, 15d supply, fill #0

## 2022-11-15 NOTE — Patient Instructions (Signed)

## 2022-11-15 NOTE — Progress Notes (Signed)
  Subjective:  Patient ID: [name], female    DOB: [birth year],   MRN: [id number]  Chief Complaint  Patient presents with   Plantar Fasciitis    NP- L foot pain    47 y.o. female presents for concern of left heel pain that has been ongoing for about month. Denies any injury. Relates heel pain with first steps in the morning or after rest. Realtes she has tried [facility] and ibuprofen without much relief.  . Denies any other pedal complaints. Denies n/v/f/c.   Past Medical History:  Diagnosis Date   Anxiety    no meds   Depression    no meds   GERD (gastroesophageal reflux disease)    Infertility, female    Tuberculosis 2002   tested positive but no active tb, neg test xray    Objective:  Physical Exam: Vascular: DP/PT pulses 2/4 bilateral. CFT <3 seconds. Normal hair growth on digits. No edema.  Skin. No lacerations or abrasions bilateral feet.  Musculoskeletal: MMT 5/5 bilateral lower extremities in DF, PF, Inversion and Eversion. Deceased ROM in DF of ankle joint.  Tender to medial calcaneal tubercle on the left. No pain along arch PT or achilles. No pain with calcaneal squeeze.  Neurological: Sensation intact to light touch.   Assessment:   1. Plantar fasciitis, left      Plan:  Patient was evaluated and treated and all questions answered. Discussed plantar fasciitis with patient.  X-rays reviewed and discussed with patient. No acute fractures or dislocations noted. Mild spurring noted at inferior calcaneus.  Discussed treatment options including, ice, NSAIDS, supportive shoes, bracing, and stretching. Stretching exercises provided to be done on a daily basis.   Prescription for diclfoenac provided and sent to pharmacy. PF brace dispensed.  Follow-up 6 weeks or sooner if any problems arise. In the meantime, encouraged to call the office with any questions, concerns, change in symptoms.     Louann Sjogren, DPM

## 2022-11-29 ENCOUNTER — Encounter (INDEPENDENT_AMBULATORY_CARE_PROVIDER_SITE_OTHER): Payer: Self-pay | Admitting: Internal Medicine

## 2022-11-29 ENCOUNTER — Ambulatory Visit (INDEPENDENT_AMBULATORY_CARE_PROVIDER_SITE_OTHER): Payer: 59 | Admitting: Internal Medicine

## 2022-11-29 VITALS — BP 136/85 | HR 82 | Temp 97.9°F | Ht 63.0 in | Wt 197.0 lb

## 2022-11-29 DIAGNOSIS — E039 Hypothyroidism, unspecified: Secondary | ICD-10-CM | POA: Diagnosis not present

## 2022-11-29 DIAGNOSIS — F32A Depression, unspecified: Secondary | ICD-10-CM | POA: Diagnosis not present

## 2022-11-29 DIAGNOSIS — E559 Vitamin D deficiency, unspecified: Secondary | ICD-10-CM

## 2022-11-29 DIAGNOSIS — R5383 Other fatigue: Secondary | ICD-10-CM | POA: Diagnosis not present

## 2022-11-29 DIAGNOSIS — R0602 Shortness of breath: Secondary | ICD-10-CM | POA: Diagnosis not present

## 2022-11-29 DIAGNOSIS — E669 Obesity, unspecified: Secondary | ICD-10-CM

## 2022-11-29 DIAGNOSIS — E668 Other obesity: Secondary | ICD-10-CM | POA: Diagnosis not present

## 2022-11-29 DIAGNOSIS — R7309 Other abnormal glucose: Secondary | ICD-10-CM | POA: Diagnosis not present

## 2022-11-29 DIAGNOSIS — Z6834 Body mass index (BMI) 34.0-34.9, adult: Secondary | ICD-10-CM

## 2022-11-29 DIAGNOSIS — Z1331 Encounter for screening for depression: Secondary | ICD-10-CM

## 2022-11-29 NOTE — Assessment & Plan Note (Signed)
She has a history of vitamin D deficiency.  We will check vitamin D levels as this may result in leptin resistance and weight gain.

## 2022-11-29 NOTE — Progress Notes (Unsigned)
Chief Complaint:   OBESITY [name] (MR# [id number]) is a 47 y.o. female who presents for evaluation and treatment of obesity and related comorbidities. Current BMI is Body mass index is 34.9 kg/m. Zuly has been struggling with her weight for many years and has been unsuccessful in either losing weight, maintaining weight loss, or reaching her healthy weight goal.  Ascencion is currently in the action stage of change and ready to dedicate time achieving and maintaining a healthier weight. Taniyha is interested in becoming our patient and working on intensive lifestyle modifications including (but not limited to) diet and exercise for weight loss.  Kairy's habits were reviewed today and are as follows: Her family eats meals together, she thinks her family will eat healthier with her, her desired weight loss is 67 lbs, she started gaining weight within the most recent 2 years (40 lbs), her heaviest weight ever was 194-197 pounds, she snacks frequently in the evenings, she skips meals frequently, she is frequently drinking liquids with calories, she frequently makes poor food choices, and she struggles with emotional eating.  Depression Screen Annis's Food and Mood (modified PHQ-9) score was 14.  Subjective:   1. Other fatigue Talyssa admits to daytime somnolence and admits to waking up still tired. Patient has a history of symptoms of daytime fatigue, morning fatigue, and morning headache. Ismay generally gets 3 or 4 hours of sleep per night, and states that she has nightime awakenings. Snoring is present. Apneic episodes are not present. Epworth Sleepiness Score is 5.   2. SOB (shortness of breath) on exertion Victorino Dike notes increasing shortness of breath with exercising and seems to be worsening over time with weight gain. She notes getting out of breath sooner with activity than she used to. This has not gotten worse recently. Remingtyn denies shortness of breath at  rest or orthopnea.  3. Abnormal glucose Patient at risk for diabetes. Recent fasting blood glucose was above 100.  4. Vitamin D deficiency She has a history of vitamin D deficiency.   5. Hypothyroidism, unspecified type On thyroid replacement therapy.  Recently adjusted by PCP.  Assessment/Plan:   1. Other fatigue Tavie does feel that her weight is causing her energy to be lower than it should be. Fatigue may be related to obesity, depression or many other causes. Labs will be ordered, and in the meanwhile, Ha will focus on self care including making healthy food choices, increasing physical activity and focusing on stress reduction.  - EKG 12-Lead - Vitamin B12 - CBC with Differential/Platelet  2. SOB (shortness of breath) on exertion Bevyn does feel that she gets out of breath more easily that she used to when she exercises. Janiyla's shortness of breath appears to be obesity related and exercise induced. She has agreed to work on weight loss and gradually increase exercise to treat her exercise induced shortness of breath. Will continue to monitor closely.  3. Abnormal glucose We will check a fasting blood sugar, insulin levels and hemoglobin A1c.    - Comprehensive metabolic panel - Hemoglobin A1c - Insulin, random - Lipid Panel With LDL/HDL Ratio  4. Vitamin D deficiency We will check vitamin D levels as this may result in leptin resistance and weight gain.  - VITAMIN D 25 Hydroxy (Vit-D Deficiency, Fractures)  5. Hypothyroidism, unspecified type Will check TSH for therapeutic dosing.  - TSH  6. Depression screen Ahriya had a positive depression screening. Depression is commonly associated with obesity and often results  in emotional eating behaviors. We will monitor this closely and work on CBT to help improve the non-hunger eating patterns. Referral to Psychology may be required if no improvement is seen as she continues in our clinic.  7. Class 1  obesity with serious comorbidity and body mass index (BMI) of 34.0 to 34.9 in adult, unspecified obesity type Megean is currently in the action stage of change and her goal is to continue with weight loss efforts. I recommend Kalana begin the structured treatment plan as follows:  She has agreed to the Category 2 Plan + 100 calories.  Handouts on protein shakes and prepackaged meals were provided.  Exercise goals: All adults should avoid inactivity. Some physical activity is better than none, and adults who participate in any amount of physical activity gain some health benefits.   Behavioral modification strategies: increasing lean protein intake, decreasing simple carbohydrates, increasing vegetables, increasing water intake, decreasing liquid calories, increasing high fiber foods, no skipping meals, meal planning and cooking strategies, keeping healthy foods in the home, ways to avoid boredom eating, better snacking choices, emotional eating strategies, and planning for success.  She was informed of the importance of frequent follow-up visits to maximize her success with intensive lifestyle modifications for her multiple health conditions. She was informed we would discuss her lab results at her next visit unless there is a critical issue that needs to be addressed sooner. Ceira agreed to keep her next visit at the agreed upon time to discuss these results.  Objective:   Blood pressure 136/85, pulse 82, temperature 97.9 F (36.6 C), height 5\' 3"  (1.6 m), weight 197 lb (89.4 kg), last menstrual period 10/16/2022, SpO2 97%, unknown if currently breastfeeding. Body mass index is 34.9 kg/m.  EKG: Normal sinus rhythm, rate 72 BPM.  Indirect Calorimeter completed today shows a VO2 of 262 and a REE of 1800.  Her calculated basal metabolic rate is 1610 thus her basal metabolic rate is better than expected.  General: Cooperative, alert, well developed, in no acute distress. HEENT:  Conjunctivae and lids unremarkable. Cardiovascular: Regular rhythm.  Lungs: Normal work of breathing. Neurologic: No focal deficits.   No results found for: "CREATININE", "BUN", "NA", "K", "CL", "CO2" No results found for: "ALT", "AST", "GGT", "ALKPHOS", "BILITOT" No results found for: "HGBA1C" No results found for: "INSULIN" No results found for: "TSH" No results found for: "CHOL", "HDL", "LDLCALC", "LDLDIRECT", "TRIG", "CHOLHDL" Lab Results  Component Value Date   WBC 12.9 (H) 01/25/2012   HGB 10.3 (L) 01/25/2012   HCT 31.5 (L) 01/25/2012   MCV 94.3 01/25/2012   PLT 145 (L) 01/25/2012   No results found for: "IRON", "TIBC", "FERRITIN"  Attestation Statements:   Reviewed by clinician on day of visit: allergies, medications, problem list, medical history, surgical history, family history, social history, and previous encounter notes.  Time spent on visit including pre-visit chart review and post-visit charting and care was 40 minutes.   Trude Mcburney, am acting as transcriptionist for Worthy Rancher, MD.  I have reviewed the above documentation for accuracy and completeness, and I agree with the above. -Worthy Rancher, MD

## 2022-11-29 NOTE — Assessment & Plan Note (Signed)
Patient at risk for diabetes.  We will check a fasting blood sugar, insulin levels and hemoglobin A1c.  Recent fasting blood glucose was above 100.

## 2022-11-29 NOTE — Assessment & Plan Note (Signed)
On thyroid replacement therapy.  Recently adjusted by PCP.  Will check TSH for therapeutic dosing.

## 2022-12-11 DIAGNOSIS — H524 Presbyopia: Secondary | ICD-10-CM | POA: Diagnosis not present

## 2022-12-11 DIAGNOSIS — H5203 Hypermetropia, bilateral: Secondary | ICD-10-CM | POA: Diagnosis not present

## 2022-12-11 DIAGNOSIS — E559 Vitamin D deficiency, unspecified: Secondary | ICD-10-CM | POA: Diagnosis not present

## 2022-12-11 DIAGNOSIS — E039 Hypothyroidism, unspecified: Secondary | ICD-10-CM | POA: Diagnosis not present

## 2022-12-11 DIAGNOSIS — R5383 Other fatigue: Secondary | ICD-10-CM | POA: Diagnosis not present

## 2022-12-11 DIAGNOSIS — R7309 Other abnormal glucose: Secondary | ICD-10-CM | POA: Diagnosis not present

## 2022-12-12 ENCOUNTER — Encounter: Payer: Self-pay | Admitting: Physician Assistant

## 2022-12-12 DIAGNOSIS — F332 Major depressive disorder, recurrent severe without psychotic features: Secondary | ICD-10-CM | POA: Diagnosis not present

## 2022-12-12 DIAGNOSIS — F419 Anxiety disorder, unspecified: Secondary | ICD-10-CM | POA: Diagnosis not present

## 2022-12-12 LAB — COMPREHENSIVE METABOLIC PANEL
ALT: 20 IU/L (ref 0–32)
AST: 20 IU/L (ref 0–40)
Albumin: 4.1 g/dL (ref 3.9–4.9)
Alkaline Phosphatase: 82 IU/L (ref 44–121)
BUN/Creatinine Ratio: 17 (ref 9–23)
BUN: 13 mg/dL (ref 6–24)
Bilirubin Total: 0.2 mg/dL (ref 0.0–1.2)
CO2: 20 mmol/L (ref 20–29)
Calcium: 9 mg/dL (ref 8.7–10.2)
Chloride: 100 mmol/L (ref 96–106)
Creatinine, Ser: 0.78 mg/dL (ref 0.57–1.00)
Globulin, Total: 3 g/dL (ref 1.5–4.5)
Glucose: 104 mg/dL — ABNORMAL HIGH (ref 70–99)
Potassium: 4.4 mmol/L (ref 3.5–5.2)
Sodium: 135 mmol/L (ref 134–144)
Total Protein: 7.1 g/dL (ref 6.0–8.5)
eGFR: 94 mL/min/{1.73_m2} (ref >59)

## 2022-12-12 LAB — CBC WITH DIFFERENTIAL/PLATELET
Basophils Absolute: 0 10*3/uL (ref 0.0–0.2)
Basos: 0 %
EOS (ABSOLUTE): 0.1 10*3/uL (ref 0.0–0.4)
Eos: 2 %
Hematocrit: 39.2 % (ref 34.0–46.6)
Hemoglobin: 13 g/dL (ref 11.1–15.9)
Immature Grans (Abs): 0 10*3/uL (ref 0.0–0.1)
Immature Granulocytes: 0 %
Lymphocytes Absolute: 1.7 10*3/uL (ref 0.7–3.1)
Lymphs: 20 %
MCH: 30.6 pg (ref 26.6–33.0)
MCHC: 33.2 g/dL (ref 31.5–35.7)
MCV: 92 fL (ref 79–97)
Monocytes Absolute: 0.7 10*3/uL (ref 0.1–0.9)
Monocytes: 8 %
Neutrophils Absolute: 6.3 10*3/uL (ref 1.4–7.0)
Neutrophils: 70 %
Platelets: 247 10*3/uL (ref 150–450)
RBC: 4.25 x10E6/uL (ref 3.77–5.28)
RDW: 13.4 % (ref 11.7–15.4)
WBC: 8.9 10*3/uL (ref 3.4–10.8)

## 2022-12-12 LAB — HEMOGLOBIN A1C
Est. average glucose Bld gHb Est-mCnc: 120 mg/dL
Hgb A1c MFr Bld: 5.8 % — ABNORMAL HIGH (ref 4.8–5.6)

## 2022-12-12 LAB — LIPID PANEL WITH LDL/HDL RATIO
Cholesterol, Total: 171 mg/dL (ref 100–199)
HDL: 44 mg/dL (ref >39)
LDL Chol Calc (NIH): 113 mg/dL — ABNORMAL HIGH (ref 0–99)
LDL/HDL Ratio: 2.6 ratio (ref 0.0–3.2)
Triglycerides: 74 mg/dL (ref 0–149)
VLDL Cholesterol Cal: 14 mg/dL (ref 5–40)

## 2022-12-12 LAB — VITAMIN D 25 HYDROXY (VIT D DEFICIENCY, FRACTURES): Vit D, 25-Hydroxy: 31 ng/mL (ref 30.0–100.0)

## 2022-12-12 LAB — TSH: TSH: 3.03 u[IU]/mL (ref 0.450–4.500)

## 2022-12-12 LAB — INSULIN, RANDOM: INSULIN: 14.4 u[IU]/mL (ref 2.6–24.9)

## 2022-12-12 LAB — VITAMIN B12: Vitamin B-12: 369 pg/mL (ref 232–1245)

## 2022-12-13 ENCOUNTER — Encounter (INDEPENDENT_AMBULATORY_CARE_PROVIDER_SITE_OTHER): Payer: Self-pay | Admitting: Internal Medicine

## 2022-12-13 ENCOUNTER — Other Ambulatory Visit (HOSPITAL_COMMUNITY): Payer: Self-pay

## 2022-12-13 ENCOUNTER — Ambulatory Visit (INDEPENDENT_AMBULATORY_CARE_PROVIDER_SITE_OTHER): Payer: 59 | Admitting: Internal Medicine

## 2022-12-13 VITALS — BP 129/85 | HR 77 | Ht 63.0 in | Wt 193.0 lb

## 2022-12-13 DIAGNOSIS — E559 Vitamin D deficiency, unspecified: Secondary | ICD-10-CM | POA: Diagnosis not present

## 2022-12-13 DIAGNOSIS — Z6834 Body mass index (BMI) 34.0-34.9, adult: Secondary | ICD-10-CM | POA: Diagnosis not present

## 2022-12-13 DIAGNOSIS — E669 Obesity, unspecified: Secondary | ICD-10-CM

## 2022-12-13 DIAGNOSIS — R7303 Prediabetes: Secondary | ICD-10-CM | POA: Diagnosis not present

## 2022-12-13 DIAGNOSIS — R638 Other symptoms and signs concerning food and fluid intake: Secondary | ICD-10-CM

## 2022-12-13 DIAGNOSIS — E88819 Insulin resistance, unspecified: Secondary | ICD-10-CM | POA: Insufficient documentation

## 2022-12-13 MED ORDER — METFORMIN HCL ER 500 MG PO TB24
500.0000 mg | ORAL_TABLET | Freq: Two times a day (BID) | ORAL | 0 refills | Status: DC
  Filled 2022-12-13: qty 60, 30d supply, fill #0

## 2022-12-13 MED ORDER — VITAMIN D3 50 MCG (2000 UT) PO CAPS: 2000.0000 [IU] | ORAL_CAPSULE | Freq: Every day | ORAL | Status: AC

## 2022-12-13 NOTE — Assessment & Plan Note (Signed)
Most recent A1c is  Lab Results  Component Value Date   HGBA1C 5.8 (H) 12/11/2022    Patient aware of disease state and risk of progression. This may contribute to abnormal cravings, fatigue and diabetic complications without having diabetes.   We have discussed treatment options which include: losing 7 to 10% of body weight, increasing physical activity to a goal of 150 minutes a week at moderate intensity.  Advised to maintain a diet low on simple and processed carbohydrates.  After discussion of benefits and side effect she will be started on metformin XR 500 mg twice a day for pharmacoprophylaxis.

## 2022-12-13 NOTE — Assessment & Plan Note (Signed)
Her vitamin D levels are low normal.  I would like to see her levels around 50 to 60 mg/dL.  She will start vitamin D3 2000 international units daily over-the-counter.

## 2022-12-13 NOTE — Assessment & Plan Note (Signed)
[name] has lost 4 pounds, she has struggled with weight loss for most of her adult life.  She at times feels discouraged and guilty at the same time as she feels she could do better but sometimes does not have the motivation to be consistent.  I think she would benefit from cognitive behavioral therapy and incretin therapy as she has strong orixegenic signaling which affects adherence with the reduced calorie nutrition plan.  She will continue on implementation of plan with emphasis on getting 30 to 40 g of protein per meal and reducing simple and added sugars.  Also increasing water to 90 ounces per day.

## 2022-12-13 NOTE — Assessment & Plan Note (Signed)
She has increased orixegenic signaling, impaired satiety and inhibitory control. This is secondary to an abnormal energy regulation system and pathological neurohormonal pathways characteristic of excess adiposity.  In addition to nutritional and behavioral strategies she benefits from pharmacotherapy.  We discussed the benefits of incretin therapy at present time this is not covered by her insurance and is cost prohibitive.  She will be started on metformin and we will consider phentermine and topiramate next.

## 2022-12-13 NOTE — Progress Notes (Signed)
Office: [phone]  /  Fax: [phone]  WEIGHT SUMMARY AND BIOMETRICS  Vitals BP: 129/85 Pulse Rate: 77 SpO2: 97 %   Anthropometric Measurements Height: 5\' 3"  (1.6 m) Weight: 193 lb (87.5 kg) BMI (Calculated): 34.2 Weight at Last Visit: 197 lb Weight Lost Since Last Visit: 4 lb Weight Gained Since Last Visit: 0 lb Starting Weight: 197 lb Total Weight Loss (lbs): 4 lb (1.814 kg) Peak Weight: 197 lb   Body Composition  Body Fat %: 41.4 % Fat Mass (lbs): 80 lbs Muscle Mass (lbs): 107.4 lbs Total Body Water (lbs): 77.8 lbs Visceral Fat Rating : 10    RMR: 1800  Today's Visit #: 2  Starting Date: 11/29/22   HPI  Chief Complaint: OBESITY  [name] is here to discuss her progress with her obesity treatment plan. She is on the the Category 3 Plan and states she is following her eating plan approximately 20 % of the time. She states she is exercising 60 minutes 3 times per week.  Interval History:  Since last office visit she has lost 4 lbs . She reports variable adherence to reduced calorie nutritional plan She has been working on increasing protein intake at every meal  Orixegenic Control: Reports problems with appetite and hunger signals.  Reports problems with satiety and satiation.  Reports problems with eating patterns and portion control.  Reports abnormal cravings. Denies feeling deprived or restricted.   Barriers identified: strong hunger signals and appetite, having difficulty focusing on healthy eating, cost of medication, multiple competing priorities, and work schedule.   Pharmacotherapy for weight loss: She is currently taking no anti-obesity medication.    ASSESSMENT AND PLAN  TREATMENT PLAN FOR OBESITY:  Recommended Dietary Goals  [name] is currently in the action stage of change. As such, her goal is to continue weight management plan. She has agreed to: continue to work on implementation of reduced calorie nutrition plan  (RCNP)  Behavioral Intervention  We discussed the following Behavioral Modification Strategies today: increasing lean protein intake, decreasing simple carbohydrates , increasing vegetables, increasing lower glycemic fruits, increasing fiber rich foods, avoiding skipping meals, increasing water intake, planning for success, and consider referral for cognitive behavioral therapy .  Additional resources provided today: Handout on FDA approved anti-obesity medications and adverse effects  Recommended Physical Activity Goals  [name] has been advised to work up to 150 minutes of moderate intensity aerobic activity a week and strengthening exercises 2-3 times per week for cardiovascular health, weight loss maintenance and preservation of muscle mass.   She has agreed to :  Think about ways to increase daily physical activity and overcoming barriers to exercise and Increase physical activity in their day and reduce sedentary time (increase NEAT).  Pharmacotherapy We discussed various medication options to help [name] with her weight loss efforts and we both agreed to :  Explore antiobesity medications.  She would benefit from incretin therapy but at present time is cost prohibitive.  She will be started on metformin XR 500 mg twice daily for diabetes prevention.  We will consider phentermine and topiramate next.  ASSOCIATED CONDITIONS ADDRESSED TODAY  Prediabetes Assessment & Plan: Most recent A1c is  Lab Results  Component Value Date   HGBA1C 5.8 (H) 12/11/2022    Patient aware of disease state and risk of progression. This may contribute to abnormal cravings, fatigue and diabetic complications without having diabetes.   We have discussed treatment options which include: losing 7 to 10% of body weight, increasing physical  activity to a goal of 150 minutes a week at moderate intensity.  Advised to maintain a diet low on simple and processed carbohydrates.  After discussion of benefits  and side effect she will be started on metformin XR 500 mg twice a day for pharmacoprophylaxis.   Orders: -     metFORMIN HCl ER; Take 1 tablet (500 mg total) by mouth 2 (two) times daily with a meal.  Dispense: 60 tablet; Refill: 0  Insulin resistance Assessment & Plan: Her HOMA-IR is 3.6 which is elevated. Optimal level < 1.9.   This is complex condition associated with genetics, ectopic fat and lifestyle factors. Insulin resistance may also result in weight gain, abnormal cravings (particularly for carbs) and fatigue. This may result in additional weight gain and lead to pre-diabetes and diabetes if untreated. In addition, hyperinsulinemia increases cardiovascular risk, chronic inflammatory response and may increase the risk of obesity related malignancies.  Lab Results  Component Value Date   HGBA1C 5.8 (H) 12/11/2022   Lab Results  Component Value Date   INSULIN 14.4 12/11/2022   Lab Results  Component Value Date   GLUCOSE 104 (H) 12/11/2022    We reviewed treatment options which include losing 7 to 10% of body weight, increasing physical activity to a 150 minutes a week at moderate intensity.  She benefits from reducing simple and processed carbs in diet.  After discussion of benefits and side effect she will be started on metformin XR 500 mg twice daily.  She is also a candidate for incretin therapy.   Abnormal food appetite Assessment & Plan: She has increased orixegenic signaling, impaired satiety and inhibitory control. This is secondary to an abnormal energy regulation system and pathological neurohormonal pathways characteristic of excess adiposity.  In addition to nutritional and behavioral strategies she benefits from pharmacotherapy.  We discussed the benefits of incretin therapy at present time this is not covered by her insurance and is cost prohibitive.  She will be started on metformin and we will consider phentermine and topiramate next.    Class 1 obesity  with serious comorbidity and body mass index (BMI) of 34.0 to 34.9 in adult, unspecified obesity type Assessment & Plan: [name] has lost 4 pounds, she has struggled with weight loss for most of her adult life.  She at times feels discouraged and guilty at the same time as she feels she could do better but sometimes does not have the motivation to be consistent.  I think she would benefit from cognitive behavioral therapy and incretin therapy as she has strong orixegenic signaling which affects adherence with the reduced calorie nutrition plan.  She will continue on implementation of plan with emphasis on getting 30 to 40 g of protein per meal and reducing simple and added sugars.  Also increasing water to 90 ounces per day.   Vitamin D deficiency Assessment & Plan: Her vitamin D levels are low normal.  I would like to see her levels around 50 to 60 mg/dL.  She will start vitamin D3 2000 international units daily over-the-counter.   Other orders -     Vitamin D3; Take 1 capsule (2,000 Units total) by mouth daily.    PHYSICAL EXAM:  Blood pressure 129/85, pulse 77, height 5\' 3"  (1.6 m), weight 193 lb (87.5 kg), last menstrual period 10/16/2022, SpO2 97%, unknown if currently breastfeeding. Body mass index is 34.19 kg/m.  General: She is overweight, cooperative, alert, well developed, and in no acute distress. PSYCH: Has normal mood, affect  and thought process.   HEENT: EOMI, sclerae are anicteric. Lungs: Normal breathing effort, no conversational dyspnea. Extremities: No edema.  Neurologic: No gross sensory or motor deficits. No tremors or fasciculations noted.    DIAGNOSTIC DATA REVIEWED:  BMET    Component Value Date/Time   NA 135 12/11/2022 0810   K 4.4 12/11/2022 0810   CL 100 12/11/2022 0810   CO2 20 12/11/2022 0810   GLUCOSE 104 (H) 12/11/2022 0810   BUN 13 12/11/2022 0810   CREATININE 0.78 12/11/2022 0810   CALCIUM 9.0 12/11/2022 0810   Lab Results  Component Value  Date   HGBA1C 5.8 (H) 12/11/2022   Lab Results  Component Value Date   INSULIN 14.4 12/11/2022   Lab Results  Component Value Date   TSH 3.030 12/11/2022   CBC    Component Value Date/Time   WBC 8.9 12/11/2022 0810   WBC 12.9 (H) 01/25/2012 0608   RBC 4.25 12/11/2022 0810   RBC 3.34 (L) 01/25/2012 0608   HGB 13.0 12/11/2022 0810   HCT 39.2 12/11/2022 0810   PLT 247 12/11/2022 0810   MCV 92 12/11/2022 0810   MCH 30.6 12/11/2022 0810   MCH 30.8 01/25/2012 0608   MCHC 33.2 12/11/2022 0810   MCHC 32.7 01/25/2012 0608   RDW 13.4 12/11/2022 0810   Iron Studies No results found for: "IRON", "TIBC", "FERRITIN", "IRONPCTSAT" Lipid Panel     Component Value Date/Time   CHOL 171 12/11/2022 0810   TRIG 74 12/11/2022 0810   HDL 44 12/11/2022 0810   LDLCALC 113 (H) 12/11/2022 0810   Hepatic Function Panel     Component Value Date/Time   PROT 7.1 12/11/2022 0810   ALBUMIN 4.1 12/11/2022 0810   AST 20 12/11/2022 0810   ALT 20 12/11/2022 0810   ALKPHOS 82 12/11/2022 0810   BILITOT 0.2 12/11/2022 0810      Component Value Date/Time   TSH 3.030 12/11/2022 0810   Nutritional Lab Results  Component Value Date   VD25OH 31.0 12/11/2022     Return for Week of Sept 30th - 5 weeks - with Dr. Rikki Spearing.Marland Kitchen She was informed of the importance of frequent follow up visits to maximize her success with intensive lifestyle modifications for her multiple health conditions.   ATTESTASTION STATEMENTS:  Reviewed by clinician on day of visit: allergies, medications, problem list, medical history, surgical history, family history, social history, and previous encounter notes.     Worthy Rancher, MD

## 2022-12-13 NOTE — Assessment & Plan Note (Signed)
Her HOMA-IR is 3.6 which is elevated. Optimal level < 1.9.   This is complex condition associated with genetics, ectopic fat and lifestyle factors. Insulin resistance may also result in weight gain, abnormal cravings (particularly for carbs) and fatigue. This may result in additional weight gain and lead to pre-diabetes and diabetes if untreated. In addition, hyperinsulinemia increases cardiovascular risk, chronic inflammatory response and may increase the risk of obesity related malignancies.  Lab Results  Component Value Date   HGBA1C 5.8 (H) 12/11/2022   Lab Results  Component Value Date   INSULIN 14.4 12/11/2022   Lab Results  Component Value Date   GLUCOSE 104 (H) 12/11/2022    We reviewed treatment options which include losing 7 to 10% of body weight, increasing physical activity to a 150 minutes a week at moderate intensity.  She benefits from reducing simple and processed carbs in diet.  After discussion of benefits and side effect she will be started on metformin XR 500 mg twice daily.  She is also a candidate for incretin therapy.

## 2022-12-14 ENCOUNTER — Other Ambulatory Visit (HOSPITAL_COMMUNITY): Payer: Self-pay

## 2022-12-27 ENCOUNTER — Other Ambulatory Visit (HOSPITAL_COMMUNITY): Payer: Self-pay

## 2022-12-27 ENCOUNTER — Other Ambulatory Visit: Payer: Self-pay

## 2022-12-27 MED ORDER — BUPROPION HCL ER (XL) 150 MG PO TB24
300.0000 mg | ORAL_TABLET | Freq: Every morning | ORAL | 0 refills | Status: DC
  Filled 2022-12-27: qty 60, 30d supply, fill #0

## 2023-01-15 ENCOUNTER — Encounter (INDEPENDENT_AMBULATORY_CARE_PROVIDER_SITE_OTHER): Payer: Self-pay | Admitting: Family Medicine

## 2023-01-15 ENCOUNTER — Ambulatory Visit (INDEPENDENT_AMBULATORY_CARE_PROVIDER_SITE_OTHER): Payer: 59 | Admitting: Family Medicine

## 2023-01-15 ENCOUNTER — Other Ambulatory Visit (HOSPITAL_COMMUNITY): Payer: Self-pay

## 2023-01-15 VITALS — BP 129/88 | HR 69 | Temp 98.5°F | Ht 63.0 in | Wt 190.0 lb

## 2023-01-15 DIAGNOSIS — F32A Depression, unspecified: Secondary | ICD-10-CM | POA: Diagnosis not present

## 2023-01-15 DIAGNOSIS — F419 Anxiety disorder, unspecified: Secondary | ICD-10-CM | POA: Diagnosis not present

## 2023-01-15 DIAGNOSIS — R7303 Prediabetes: Secondary | ICD-10-CM | POA: Diagnosis not present

## 2023-01-15 DIAGNOSIS — Z6833 Body mass index (BMI) 33.0-33.9, adult: Secondary | ICD-10-CM | POA: Diagnosis not present

## 2023-01-15 DIAGNOSIS — E669 Obesity, unspecified: Secondary | ICD-10-CM | POA: Diagnosis not present

## 2023-01-15 DIAGNOSIS — E66811 Obesity, class 1: Secondary | ICD-10-CM

## 2023-01-15 MED ORDER — METFORMIN HCL ER 500 MG PO TB24
500.0000 mg | ORAL_TABLET | Freq: Two times a day (BID) | ORAL | 0 refills | Status: DC
  Filled 2023-01-15: qty 60, 30d supply, fill #0

## 2023-01-15 MED ORDER — SERTRALINE HCL 50 MG PO TABS
50.0000 mg | ORAL_TABLET | Freq: Every day | ORAL | 0 refills | Status: DC
  Filled 2023-01-15: qty 90, 90d supply, fill #0

## 2023-01-15 NOTE — Progress Notes (Signed)
Chief Complaint:   OBESITY [name] is here to discuss her progress with her obesity treatment plan along with follow-up of her obesity related diagnoses. [name] is on the Category 3 Plan and states she is following her eating plan approximately 30-40% of the time. [name] states she is walking 6,000-12,000 steps 2-6 times per week.    Today's visit was #: 3 Starting weight: 197 lbs Starting date: 11/29/2022 Today's weight: 190 lbs Today's date: 01/15/2023 Total lbs lost to date: 7 Total lbs lost since last in-office visit: 3  Interim History: [name] typically sees Dr. Rikki Spearing.  She has quite a bit of stressors in life.  She has two kids with upcoming birthdays. She doesn't have much energy to adhere to plan.   Subjective:   1. Prediabetes Patient's last A1c was 5.8 and insulin 14.4.  She is on Glucophage.  2. Anxiety and depression Patient denies suicidal or homicidal ideations.  She is on Wellbutrin 300 mg daily.  Assessment/Plan:   1. Prediabetes We will refill Glucophage 500 mg twice daily for 1 month.  - metFORMIN (GLUCOPHAGE-XR) 500 MG 24 hr tablet; Take 1 tablet (500 mg total) by mouth 2 (two) times daily with a meal.  Dispense: 60 tablet; Refill: 0  2. Anxiety and depression Patient agreed to start sertraline 50 mg once daily with a 90-day supply.  We will follow-up at her next appointment.  - sertraline (ZOLOFT) 50 MG tablet; Take 1 tablet (50 mg total) by mouth daily.  Dispense: 90 tablet; Refill: 0  3. BMI 33.0-33.9,adult  4. Obesity with BMI of 34.9 [name] is currently in the action stage of change. As such, her goal is to continue with weight loss efforts. She has agreed to the Category 3 Plan and keeping a food journal and adhering to recommended goals of 1450-1550 calories and 90+ grams of protein daily.   Exercise goals: No exercise has been prescribed at this time.  Behavioral modification strategies: increasing lean protein intake, meal  planning and cooking strategies, keeping healthy foods in the home, and planning for success.  [name] has agreed to follow-up with our clinic in 3 weeks. She was informed of the importance of frequent follow-up visits to maximize her success with intensive lifestyle modifications for her multiple health conditions.   Objective:   Blood pressure 129/88, pulse 69, temperature 98.5 F (36.9 C), height 5\' 3"  (1.6 m), weight 190 lb (86.2 kg), SpO2 97%, unknown if currently breastfeeding. Body mass index is 33.66 kg/m.  General: Cooperative, alert, well developed, in no acute distress. HEENT: Conjunctivae and lids unremarkable. Cardiovascular: Regular rhythm.  Lungs: Normal work of breathing. Neurologic: No focal deficits.   Lab Results  Component Value Date   CREATININE 0.78 12/11/2022   BUN 13 12/11/2022   NA 135 12/11/2022   K 4.4 12/11/2022   CL 100 12/11/2022   CO2 20 12/11/2022   Lab Results  Component Value Date   ALT 20 12/11/2022   AST 20 12/11/2022   ALKPHOS 82 12/11/2022   BILITOT 0.2 12/11/2022   Lab Results  Component Value Date   HGBA1C 5.8 (H) 12/11/2022   Lab Results  Component Value Date   INSULIN 14.4 12/11/2022   Lab Results  Component Value Date   TSH 3.030 12/11/2022   Lab Results  Component Value Date   CHOL 171 12/11/2022   HDL 44 12/11/2022   LDLCALC 113 (H) 12/11/2022   TRIG 74 12/11/2022   Lab Results  Component Value  Date   VD25OH 31.0 12/11/2022   Lab Results  Component Value Date   WBC 8.9 12/11/2022   HGB 13.0 12/11/2022   HCT 39.2 12/11/2022   MCV 92 12/11/2022   PLT 247 12/11/2022   No results found for: "IRON", "TIBC", "FERRITIN"  Attestation Statements:   Reviewed by clinician on day of visit: allergies, medications, problem list, medical history, surgical history, family history, social history, and previous encounter notes.  Time spent on visit including pre-visit chart review and post-visit care and charting was 45  minutes.   I, Burt Knack, am acting as transcriptionist for Reuben Likes, MD.  I have reviewed the above documentation for accuracy and completeness, and I agree with the above. - Reuben Likes, MD

## 2023-01-17 ENCOUNTER — Ambulatory Visit (INDEPENDENT_AMBULATORY_CARE_PROVIDER_SITE_OTHER): Payer: 59 | Admitting: Internal Medicine

## 2023-02-05 ENCOUNTER — Encounter (INDEPENDENT_AMBULATORY_CARE_PROVIDER_SITE_OTHER): Payer: Self-pay | Admitting: Family Medicine

## 2023-02-05 ENCOUNTER — Ambulatory Visit (INDEPENDENT_AMBULATORY_CARE_PROVIDER_SITE_OTHER): Payer: 59 | Admitting: Family Medicine

## 2023-02-05 ENCOUNTER — Other Ambulatory Visit (HOSPITAL_COMMUNITY): Payer: Self-pay

## 2023-02-05 VITALS — BP 122/83 | HR 75 | Temp 98.3°F | Ht 63.0 in | Wt 189.0 lb

## 2023-02-05 DIAGNOSIS — F32A Depression, unspecified: Secondary | ICD-10-CM | POA: Diagnosis not present

## 2023-02-05 DIAGNOSIS — R7303 Prediabetes: Secondary | ICD-10-CM

## 2023-02-05 DIAGNOSIS — E669 Obesity, unspecified: Secondary | ICD-10-CM | POA: Diagnosis not present

## 2023-02-05 DIAGNOSIS — F419 Anxiety disorder, unspecified: Secondary | ICD-10-CM | POA: Diagnosis not present

## 2023-02-05 DIAGNOSIS — Z6834 Body mass index (BMI) 34.0-34.9, adult: Secondary | ICD-10-CM

## 2023-02-05 DIAGNOSIS — Z6833 Body mass index (BMI) 33.0-33.9, adult: Secondary | ICD-10-CM

## 2023-02-05 MED ORDER — BUPROPION HCL ER (XL) 150 MG PO TB24
300.0000 mg | ORAL_TABLET | Freq: Every morning | ORAL | 0 refills | Status: DC
  Filled 2023-02-05: qty 180, 90d supply, fill #0

## 2023-02-05 NOTE — Progress Notes (Signed)
Chief Complaint:   OBESITY [name] is here to discuss her progress with her obesity treatment plan along with follow-up of her obesity related diagnoses. [name] is on the Category 3 Plan and keeping a food journal and adhering to recommended goals of 1450-1550 calories and 90+ grams of protein and states she is following her eating plan approximately 60% of the time. [name] states she is walking 6,000-11,000 steps 7 times per week.  Today's visit was #: 4 Starting weight: 197 lbs Starting date: 11/29/2022 Today's weight: 189 lbs Today's date: 02/05/2023 Total lbs lost to date: 8 Total lbs lost since last in-office visit: 1  Interim History: Since last appointment patient had a birthday.  She went out with her family and two of her kids had birthdays.  One of her kids just recently got diagnosed with anorexia nervosa restricting type. She did implement one of the recipes that we discussed last appointment and implemented the just bare nuggets. There were quite a few celebrations that she participated in.  Next few weeks there isn't much going on in terms of kids activities and life in general.  She does like food logging in general because she likes learning more about the food she is eating.   Subjective:   1. Prediabetes Patient is on metformin, and she denies GI side effects.  2. Anxiety and depression Patient is on Wellbutrin and Zoloft now.  She is noticing some improvement in lability with Zoloft.  She denies suicidal or homicidal ideations.  Assessment/Plan:   1. Prediabetes Patient will continue metformin with no change in dose, and she will continue with her food logging.  2. Anxiety and depression We will refill Wellbutrin XL 150 mg 2 tablets every morning for 90 days.  - buPROPion (WELLBUTRIN XL) 150 MG 24 hr tablet; Take 2 tablets by mouth every morning  Dispense: 180 tablet; Refill: 0  3. BMI 33.0-33.9,adult  4. Obesity with BMI of 34.9 Patient to start  food log or journaling meal plan.  The initial goal will be to habitually log or journal for at least 4 days a week.  The expectation it that patient may not initially meet calorie or protein goals as the nturitional understanding of food intake is begun.  We discussed the 10:1 ratio when reading a food label.  Patient agrees to keep a food log either electronically or on paper and bring to the next appointment to be able to dissect and discuss it with provider.   [place] is currently in the action stage of change. As such, her goal is to continue with weight loss efforts. She has agreed to the Category 3 Plan and keeping a food journal and adhering to recommended goals of 1450-1550 calories and 90+ grams of protein daily.   Exercise goals: All adults should avoid inactivity. Some physical activity is better than none, and adults who participate in any amount of physical activity gain some health benefits.  Behavioral modification strategies: increasing lean protein intake, meal planning and cooking strategies, and planning for success.  [name] has agreed to follow-up with our clinic in 3 to 5 weeks. She was informed of the importance of frequent follow-up visits to maximize her success with intensive lifestyle modifications for her multiple health conditions.   Objective:   Blood pressure 122/83, pulse 75, temperature 98.3 F (36.8 C), height 5\' 3"  (1.6 m), weight 189 lb (85.7 kg), SpO2 98%, unknown if currently breastfeeding. Body mass index is 33.48 kg/m.  General: Cooperative,  alert, well developed, in no acute distress. HEENT: Conjunctivae and lids unremarkable. Cardiovascular: Regular rhythm.  Lungs: Normal work of breathing. Neurologic: No focal deficits.   Lab Results  Component Value Date   CREATININE 0.78 12/11/2022   BUN 13 12/11/2022   NA 135 12/11/2022   K 4.4 12/11/2022   CL 100 12/11/2022   CO2 20 12/11/2022   Lab Results  Component Value Date   ALT 20 12/11/2022    AST 20 12/11/2022   ALKPHOS 82 12/11/2022   BILITOT 0.2 12/11/2022   Lab Results  Component Value Date   HGBA1C 5.8 (H) 12/11/2022   Lab Results  Component Value Date   INSULIN 14.4 12/11/2022   Lab Results  Component Value Date   TSH 3.030 12/11/2022   Lab Results  Component Value Date   CHOL 171 12/11/2022   HDL 44 12/11/2022   LDLCALC 113 (H) 12/11/2022   TRIG 74 12/11/2022   Lab Results  Component Value Date   VD25OH 31.0 12/11/2022   Lab Results  Component Value Date   WBC 8.9 12/11/2022   HGB 13.0 12/11/2022   HCT 39.2 12/11/2022   MCV 92 12/11/2022   PLT 247 12/11/2022   No results found for: "IRON", "TIBC", "FERRITIN"  Attestation Statements:   Reviewed by clinician on day of visit: allergies, medications, problem list, medical history, surgical history, family history, social history, and previous encounter notes.   I, Burt Knack, am acting as transcriptionist for Reuben Likes, MD.  I have reviewed the above documentation for accuracy and completeness, and I agree with the above. - Reuben Likes, MD

## 2023-02-12 ENCOUNTER — Other Ambulatory Visit (HOSPITAL_COMMUNITY): Payer: Self-pay

## 2023-02-27 ENCOUNTER — Ambulatory Visit (INDEPENDENT_AMBULATORY_CARE_PROVIDER_SITE_OTHER): Payer: 59 | Admitting: Family Medicine

## 2023-03-19 ENCOUNTER — Ambulatory Visit (INDEPENDENT_AMBULATORY_CARE_PROVIDER_SITE_OTHER): Payer: 59 | Admitting: Family Medicine

## 2023-03-28 ENCOUNTER — Encounter (INDEPENDENT_AMBULATORY_CARE_PROVIDER_SITE_OTHER): Payer: Self-pay | Admitting: Family Medicine

## 2023-03-28 ENCOUNTER — Other Ambulatory Visit (HOSPITAL_COMMUNITY): Payer: Self-pay

## 2023-03-28 ENCOUNTER — Other Ambulatory Visit: Payer: Self-pay

## 2023-03-28 ENCOUNTER — Ambulatory Visit (INDEPENDENT_AMBULATORY_CARE_PROVIDER_SITE_OTHER): Payer: 59 | Admitting: Family Medicine

## 2023-03-28 VITALS — BP 120/81 | HR 82 | Temp 98.3°F | Ht 63.0 in | Wt 188.0 lb

## 2023-03-28 DIAGNOSIS — Z6834 Body mass index (BMI) 34.0-34.9, adult: Secondary | ICD-10-CM

## 2023-03-28 DIAGNOSIS — F419 Anxiety disorder, unspecified: Secondary | ICD-10-CM

## 2023-03-28 DIAGNOSIS — R7303 Prediabetes: Secondary | ICD-10-CM | POA: Diagnosis not present

## 2023-03-28 DIAGNOSIS — F32A Depression, unspecified: Secondary | ICD-10-CM | POA: Diagnosis not present

## 2023-03-28 DIAGNOSIS — Z6833 Body mass index (BMI) 33.0-33.9, adult: Secondary | ICD-10-CM | POA: Diagnosis not present

## 2023-03-28 DIAGNOSIS — E66811 Obesity, class 1: Secondary | ICD-10-CM

## 2023-03-28 MED ORDER — METFORMIN HCL ER 500 MG PO TB24
500.0000 mg | ORAL_TABLET | Freq: Two times a day (BID) | ORAL | 0 refills | Status: DC
  Filled 2023-03-28: qty 180, 90d supply, fill #0

## 2023-03-28 MED ORDER — SERTRALINE HCL 50 MG PO TABS
75.0000 mg | ORAL_TABLET | Freq: Every day | ORAL | 0 refills | Status: DC
  Filled 2023-03-28: qty 135, 90d supply, fill #0

## 2023-03-28 NOTE — Progress Notes (Signed)
   SUBJECTIVE:  Chief Complaint: Obesity  Interim History: Patient almost canceled todays appointment because she felt discouraged.  She has been working a lot and she is figuring out some family situations.  One daughter is struggling with an eating disorder and then another daughter is struggling with emotions.  Her husband may be leaving to go to his country for 2 weeks after Christmas.  She is looking for a therapist currently.   [name] is here to discuss her progress with her obesity treatment plan. She is on the Category 3 Plan and states she is following her eating plan approximately 25 % of the time. She states she is walking.   OBJECTIVE: Visit Diagnoses: Problem List Items Addressed This Visit       Other   Anxiety and depression - Primary   Patient doing well on sertraline and bupropion.  Still working through significant life stressors.  No suicidal or homicidal ideation.  Refill of sertraline 75mg  sent in. Follow up on symptom control at next appointment.      Relevant Medications   sertraline (ZOLOFT) 50 MG tablet   Class 1 obesity with serious comorbidity and body mass index (BMI) of 34.0 to 34.9 in adult   Relevant Medications   metFORMIN (GLUCOPHAGE-XR) 500 MG 24 hr tablet   Prediabetes   Last A1c of 5.8 in August.  On metformin daily with no GI SE.  Needs refill of metformin today. Will repeat labs in 2 months with PCP or here.  Discussed mindful eating techniques that include having some indulgent food but also a good amount of nutrition daily.        Relevant Medications   metFORMIN (GLUCOPHAGE-XR) 500 MG 24 hr tablet   Other Visit Diagnoses       BMI 33.0-33.9,adult           No data recorded No data recorded No data recorded No data recorded   ASSESSMENT AND PLAN:  Diet: [place] is currently in the action stage of change. As such, her goal is to continue with weight loss efforts. She has agreed to practicing portion control and making smarter  food choices, such as increasing vegetables and decreasing simple carbohydrates.  Exercise: Brooks has been instructed that some exercise is better than none for weight loss and overall health benefits.   Behavior Modification:  We discussed the following Behavioral Modification Strategies today: increasing lean protein intake, increasing vegetables, meal planning and cooking strategies, and better snacking choices.   No follow-ups on file.Marland Kitchen She was informed of the importance of frequent follow up visits to maximize her success with intensive lifestyle modifications for her multiple health conditions.  Attestation Statements:   Reviewed by clinician on day of visit: allergies, medications, problem list, medical history, surgical history, family history, social history, and previous encounter notes.    Reuben Likes, MD

## 2023-04-02 ENCOUNTER — Other Ambulatory Visit (HOSPITAL_COMMUNITY): Payer: Self-pay

## 2023-04-07 NOTE — Assessment & Plan Note (Addendum)
Last A1c of 5.8 in August.  On metformin daily with no GI SE.  Needs refill of metformin today. Will repeat labs in 2 months with PCP or here.  Discussed mindful eating techniques that include having some indulgent food but also a good amount of nutrition daily.

## 2023-04-07 NOTE — Assessment & Plan Note (Signed)
Patient doing well on sertraline and bupropion.  Still working through significant life stressors.  No suicidal or homicidal ideation.  Refill of sertraline 75mg  sent in. Follow up on symptom control at next appointment.

## 2023-05-03 ENCOUNTER — Other Ambulatory Visit (HOSPITAL_COMMUNITY): Payer: Self-pay

## 2023-05-03 MED ORDER — LEVOTHYROXINE SODIUM 100 MCG PO TABS
ORAL_TABLET | ORAL | 5 refills | Status: DC
  Filled 2023-05-03: qty 90, 90d supply, fill #0
  Filled 2023-11-15: qty 90, 90d supply, fill #1

## 2023-05-07 ENCOUNTER — Ambulatory Visit (INDEPENDENT_AMBULATORY_CARE_PROVIDER_SITE_OTHER): Payer: 59 | Admitting: Family Medicine

## 2023-05-16 ENCOUNTER — Other Ambulatory Visit (HOSPITAL_COMMUNITY): Payer: Self-pay

## 2023-05-16 ENCOUNTER — Ambulatory Visit (INDEPENDENT_AMBULATORY_CARE_PROVIDER_SITE_OTHER): Payer: 59 | Admitting: Family Medicine

## 2023-05-16 ENCOUNTER — Encounter (INDEPENDENT_AMBULATORY_CARE_PROVIDER_SITE_OTHER): Payer: Self-pay | Admitting: Family Medicine

## 2023-05-16 VITALS — BP 122/77 | HR 115 | Temp 98.3°F | Ht 63.0 in | Wt 186.0 lb

## 2023-05-16 DIAGNOSIS — F32A Depression, unspecified: Secondary | ICD-10-CM

## 2023-05-16 DIAGNOSIS — E66811 Obesity, class 1: Secondary | ICD-10-CM

## 2023-05-16 DIAGNOSIS — Z6832 Body mass index (BMI) 32.0-32.9, adult: Secondary | ICD-10-CM | POA: Diagnosis not present

## 2023-05-16 DIAGNOSIS — F419 Anxiety disorder, unspecified: Secondary | ICD-10-CM

## 2023-05-16 DIAGNOSIS — R638 Other symptoms and signs concerning food and fluid intake: Secondary | ICD-10-CM | POA: Diagnosis not present

## 2023-05-16 MED ORDER — BUPROPION HCL ER (XL) 150 MG PO TB24
300.0000 mg | ORAL_TABLET | Freq: Every morning | ORAL | 0 refills | Status: DC
  Filled 2023-05-16: qty 180, 90d supply, fill #0

## 2023-05-16 MED ORDER — LOMAIRA 8 MG PO TABS: ORAL_TABLET | ORAL | 0 refills | Status: DC

## 2023-05-16 MED ORDER — TOPIRAMATE 25 MG PO TABS: ORAL_TABLET | ORAL | 0 refills | Status: DC

## 2023-05-16 NOTE — Progress Notes (Signed)
SUBJECTIVE:  Chief Complaint: Obesity  Interim History: Patient stayed local for the holidays.  January started not the best with life issues with her kids.  She has done 3 yoga classes with her daughter- going to [employer].  Foodwise she is eating what others in the family are eating.  She is eating fruits and vegetables daily and eating what her daughter eats.  Two of patients kids are turning 17 in 5 days.  She is off 6 days starting today.  [name] is here to discuss her progress with her obesity treatment plan. She is on the practicing portion control and making smarter food choices, such as increasing vegetables and decreasing simple carbohydrates and states she is following her eating plan approximately 100 % of the time. She states she is exercising 60 minutes 3 times per week.   OBJECTIVE: Visit Diagnoses: Problem List Items Addressed This Visit       Other   Anxiety and depression   Patient noticing improvement in symptoms on Wellbutrin 150 mg daily.  She needs a refill of this medication today.  Refill was sent in.  Will follow-up on symptoms at next appointment.      Relevant Medications   buPROPion (WELLBUTRIN XL) 150 MG 24 hr tablet   Class 1 obesity with serious comorbidity and body mass index (BMI) of 34.0 to 34.9 in adult   Starting weight of 197 pounds on November 29, 2022.  Current weight is 186.  Patient reports working on ensuring healthy nutritious food in the house and being less mindful of weighing or measuring her food due to her daughters eating disorder.  She has been consistent and incorporating more movement in the form of yoga.  Will continue portion control smart choices.      Relevant Medications   Phentermine HCl (LOMAIRA) 8 MG TABS   Abnormal food appetite - Primary   Medication options were discussed extensively with patient today.  Given availability, cost, insurance coverage and other medical comorbidities the decision was reached to start  medications Lomaira and Topiramate.  These medications will be used as a substitute for brand name Qsymia in doses that are synanomous.  Patient understands this is an off label usage.  We discussed the titration schedule with the goal of 5% weight loss at 3 months at a treatment dose.  The first two weeks will be a starting dose of 25mg  of Topiramate and 4mg  of Lomaira.  After two weeks the patient will increase to 50mg  of Topiramate and 8mg  of Lomaira and will stay on this dose until the next appointment.  Controlled substance contract was discussed and signed today; PDMP checked.  Prescriptions sent in. Risks and benefits of medications today.       Relevant Medications   Phentermine HCl (LOMAIRA) 8 MG TABS   topiramate (TOPAMAX) 25 MG tablet   Other Visit Diagnoses       BMI 32.0-32.9,adult           No data recorded  No data recorded  No data recorded  No data recorded    ASSESSMENT AND PLAN:  Diet: [name] is currently in the action stage of change. As such, her goal is to continue with weight loss efforts. She has agreed to practicing portion control and making smarter food choices, such as increasing vegetables and decreasing simple carbohydrates.  Patient is working on challenging and changing her relationship with food given her daughters eating disorder.  Exercise: [name] has been instructed to  work up to a goal of 150 minutes of combined cardio and strengthening exercise per week for weight loss and overall health benefits.   Behavior Modification:  We discussed the following Behavioral Modification Strategies today: increasing lean protein intake, increasing vegetables, no skipping meals, meal planning and cooking strategies, and keeping healthy foods in the home. We discussed various medication options to help [name] with her weight loss efforts and we both agreed to to discuss and start combination phentermine and topiramate as described above.    No  follow-ups on file.Marland Kitchen She was informed of the importance of frequent follow up visits to maximize her success with intensive lifestyle modifications for her multiple health conditions.  Attestation Statements:   Reviewed by clinician on day of visit: allergies, medications, problem list, medical history, surgical history, family history, social history, and previous encounter notes.     Reuben Likes, MD

## 2023-05-16 NOTE — Assessment & Plan Note (Addendum)
Medication options were discussed extensively with patient today.  Given availability, cost, insurance coverage and other medical comorbidities the decision was reached to start medications Lomaira and Topiramate.  These medications will be used as a substitute for brand name Qsymia in doses that are synanomous.  Patient understands this is an off label usage.  We discussed the titration schedule with the goal of 5% weight loss at 3 months at a treatment dose.  The first two weeks will be a starting dose of 25mg  of Topiramate and 4mg  of Lomaira.  After two weeks the patient will increase to 50mg  of Topiramate and 8mg  of Lomaira and will stay on this dose until the next appointment.  Controlled substance contract was discussed and signed today; PDMP checked.  Prescriptions sent in. Risks and benefits of medications today.

## 2023-05-22 NOTE — Assessment & Plan Note (Signed)
 Starting weight of 197 pounds on November 29, 2022.  Current weight is 186.  Patient reports working on ensuring healthy nutritious food in the house and being less mindful of weighing or measuring her food due to her daughters eating disorder.  She has been consistent and incorporating more movement in the form of yoga.  Will continue portion control smart choices.

## 2023-05-22 NOTE — Assessment & Plan Note (Signed)
Patient noticing improvement in symptoms on Wellbutrin 150 mg daily.  She needs a refill of this medication today.  Refill was sent in.  Will follow-up on symptoms at next appointment.

## 2023-06-12 ENCOUNTER — Other Ambulatory Visit (INDEPENDENT_AMBULATORY_CARE_PROVIDER_SITE_OTHER): Payer: Self-pay | Admitting: Family Medicine

## 2023-06-12 DIAGNOSIS — R638 Other symptoms and signs concerning food and fluid intake: Secondary | ICD-10-CM

## 2023-07-24 DIAGNOSIS — Z30431 Encounter for routine checking of intrauterine contraceptive device: Secondary | ICD-10-CM | POA: Diagnosis not present

## 2023-07-24 DIAGNOSIS — R102 Pelvic and perineal pain: Secondary | ICD-10-CM | POA: Diagnosis not present

## 2023-07-24 DIAGNOSIS — N912 Amenorrhea, unspecified: Secondary | ICD-10-CM | POA: Diagnosis not present

## 2023-08-22 DIAGNOSIS — Z1231 Encounter for screening mammogram for malignant neoplasm of breast: Secondary | ICD-10-CM | POA: Diagnosis not present

## 2023-08-22 DIAGNOSIS — N939 Abnormal uterine and vaginal bleeding, unspecified: Secondary | ICD-10-CM | POA: Diagnosis not present

## 2023-08-22 DIAGNOSIS — Z1389 Encounter for screening for other disorder: Secondary | ICD-10-CM | POA: Diagnosis not present

## 2023-08-22 DIAGNOSIS — Z1151 Encounter for screening for human papillomavirus (HPV): Secondary | ICD-10-CM | POA: Diagnosis not present

## 2023-08-22 DIAGNOSIS — Z130 Encounter for screening for diseases of the blood and blood-forming organs and certain disorders involving the immune mechanism: Secondary | ICD-10-CM | POA: Diagnosis not present

## 2023-08-22 DIAGNOSIS — Z01419 Encounter for gynecological examination (general) (routine) without abnormal findings: Secondary | ICD-10-CM | POA: Diagnosis not present

## 2023-08-22 DIAGNOSIS — Z124 Encounter for screening for malignant neoplasm of cervix: Secondary | ICD-10-CM | POA: Diagnosis not present

## 2023-08-22 LAB — HM MAMMOGRAPHY

## 2023-08-27 LAB — HM PAP SMEAR: HPV, high-risk: NEGATIVE

## 2023-10-04 ENCOUNTER — Other Ambulatory Visit (HOSPITAL_COMMUNITY): Payer: Self-pay

## 2023-10-04 MED ORDER — CLOBETASOL PROPIONATE 0.05 % EX OINT
TOPICAL_OINTMENT | Freq: Two times a day (BID) | CUTANEOUS | 0 refills | Status: DC
  Filled 2023-10-04: qty 30, 15d supply, fill #0

## 2023-10-26 ENCOUNTER — Other Ambulatory Visit (HOSPITAL_COMMUNITY): Payer: Self-pay

## 2023-11-21 ENCOUNTER — Other Ambulatory Visit (HOSPITAL_COMMUNITY): Payer: Self-pay

## 2023-11-21 MED ORDER — SERTRALINE HCL 50 MG PO TABS
75.0000 mg | ORAL_TABLET | Freq: Every day | ORAL | 0 refills | Status: DC
  Filled 2023-11-21: qty 30, 20d supply, fill #0

## 2023-11-21 MED ORDER — BUPROPION HCL ER (XL) 150 MG PO TB24
300.0000 mg | ORAL_TABLET | Freq: Every morning | ORAL | 0 refills | Status: DC
  Filled 2023-11-21: qty 30, 15d supply, fill #0

## 2023-11-24 ENCOUNTER — Other Ambulatory Visit (HOSPITAL_COMMUNITY): Payer: Self-pay

## 2023-12-11 ENCOUNTER — Other Ambulatory Visit (HOSPITAL_COMMUNITY): Payer: Self-pay

## 2023-12-19 DIAGNOSIS — Z0289 Encounter for other administrative examinations: Secondary | ICD-10-CM

## 2023-12-31 ENCOUNTER — Encounter (INDEPENDENT_AMBULATORY_CARE_PROVIDER_SITE_OTHER): Payer: Self-pay | Admitting: Family Medicine

## 2023-12-31 ENCOUNTER — Ambulatory Visit (INDEPENDENT_AMBULATORY_CARE_PROVIDER_SITE_OTHER): Admitting: Family Medicine

## 2023-12-31 VITALS — BP 161/85 | HR 70 | Ht 63.0 in | Wt 195.0 lb

## 2023-12-31 DIAGNOSIS — R7303 Prediabetes: Secondary | ICD-10-CM

## 2023-12-31 DIAGNOSIS — E66811 Obesity, class 1: Secondary | ICD-10-CM

## 2023-12-31 DIAGNOSIS — R0602 Shortness of breath: Secondary | ICD-10-CM | POA: Diagnosis not present

## 2023-12-31 DIAGNOSIS — Z6834 Body mass index (BMI) 34.0-34.9, adult: Secondary | ICD-10-CM

## 2023-12-31 NOTE — Progress Notes (Signed)
 SUBJECTIVE:  Chief Complaint: Obesity  Interim History: Patient re-establishing care since last appointment in January.  Last medication she tried made her dizzy and she didn't continue it.  She is still working with her daughter who is in recovery from a severe eating disorder.   [name] is here to discuss her progress with her obesity treatment plan. She is on the practicing portion control and making smarter food choices, such as increasing vegetables and decreasing simple carbohydrates and states she is following her eating plan approximately 50 % of the time. She states she is walking 5,000-11,000 steps 3 times per week.   OBJECTIVE: Visit Diagnoses: Problem List Items Addressed This Visit       Other   Class 1 obesity with serious comorbidity and body mass index (BMI) of 34.0 to 34.9 in adult   Prediabetes   Other Visit Diagnoses       SOBOE (shortness of breath on exertion)    -  Primary     BMI 34.0-34.9,adult           Vitals BP: (!) 161/85 Pulse Rate: 70 SpO2: 97 %   Anthropometric Measurements Height: 5' 3 (1.6 m) Weight: 195 lb (88.5 kg) BMI (Calculated): 34.55 Weight at Last Visit: 186 lb Weight Lost Since Last Visit: 0 Weight Gained Since Last Visit: 0 Starting Weight: 197lb Total Weight Loss (lbs): 0 lb (0 kg)   Body Composition  Body Fat %: 39.2 % Fat Mass (lbs): 76.4 lbs Muscle Mass (lbs): 112.6 lbs Total Body Water (lbs): 77.8 lbs Visceral Fat Rating : 10   Other Clinical Data RMR: 1915 Fasting: yes Labs: yes Today's Visit #: 7 Starting Date: 11/29/22 Comments: Re-establish     ASSESSMENT AND PLAN: Assessment & Plan SOBOE (shortness of breath on exertion) Previous resting metabolism at start of program of 1800 cal on November 29, 2022.  Repeat resting metabolic rate found by indirect calorimetry of 19th 15 today.  This is an increase a lot in patient additional calories for intake over the course of the day.  Will increase up to  category 3 with a goal of 10 ounces of meat at supper. BMI 34.0-34.9,adult  Class 1 obesity with serious comorbidity and body mass index (BMI) of 34.0 to 34.9 in adult, unspecified obesity type Patient and I discussed at length today various medication/pharmacotherapy options for the treatment of obesity.  We discussed the usage of GLP-1 injectable medications as well as the usage of Qsymia, Contrave, phentermine  alone, Wellbutrin , metformin , and topiramate .  The last 3 being off label usage for obesity.  Patient did not tolerate makeshift Qsymia due to dizziness likely from topiramate .  We discussed that 80% of the population fail with lifestyle changes alone for meaningful and sustained impact on weight management.  Patient to contemplate usage of [facility] for Zepbound vials.   Prediabetes Recent labs showing elevation in A1c to 5.8.  She reports no modifications or monitoring of dietary intake since taking a hiatus from clinic.  Will need repeat labs in December of this year.   Diet: [name] is currently in the action stage of change. As such, her goal is to continue with weight loss efforts and has agreed to the Category 3 Plan.   Exercise:  All adults should avoid inactivity. Some activity is better than none, and adults who participate in any amount of physical activity, gain some health benefits.  Behavior Modification:  We discussed the following Behavioral Modification Strategies today: increasing lean  protein intake, decreasing simple carbohydrates, increasing vegetables, meal planning and cooking strategies, keeping healthy foods in the home, and planning for success.   Return in about 4 weeks (around 01/28/2024).   She was informed of the importance of frequent follow up visits to maximize her success with intensive lifestyle modifications for her multiple health conditions.  Attestation Statements:   Reviewed by clinician on day of visit: allergies, medications,  problem list, medical history, surgical history, family history, social history, and previous encounter notes.   Ann Cho, MD

## 2023-12-31 NOTE — Assessment & Plan Note (Signed)
 Recent labs showing elevation in A1c to 5.8.  She reports no modifications or monitoring of dietary intake since taking a hiatus from clinic.  Will need repeat labs in December of this year.

## 2023-12-31 NOTE — Assessment & Plan Note (Signed)
 Patient and I discussed at length today various medication/pharmacotherapy options for the treatment of obesity.  We discussed the usage of GLP-1 injectable medications as well as the usage of Qsymia, Contrave, phentermine  alone, Wellbutrin , metformin , and topiramate .  The last 3 being off label usage for obesity.  Patient did not tolerate makeshift Qsymia due to dizziness likely from topiramate .  We discussed that 80% of the population fail with lifestyle changes alone for meaningful and sustained impact on weight management.  Patient to contemplate usage of [facility] for Zepbound vials.

## 2024-01-02 ENCOUNTER — Other Ambulatory Visit (HOSPITAL_COMMUNITY): Payer: Self-pay

## 2024-01-02 MED ORDER — BUPROPION HCL ER (XL) 150 MG PO TB24
300.0000 mg | ORAL_TABLET | Freq: Every morning | ORAL | 0 refills | Status: DC
  Filled 2024-01-02: qty 30, 15d supply, fill #0

## 2024-01-28 ENCOUNTER — Other Ambulatory Visit (HOSPITAL_COMMUNITY): Payer: Self-pay

## 2024-01-28 ENCOUNTER — Other Ambulatory Visit: Payer: Self-pay

## 2024-01-28 ENCOUNTER — Ambulatory Visit (INDEPENDENT_AMBULATORY_CARE_PROVIDER_SITE_OTHER): Admitting: Family Medicine

## 2024-01-28 VITALS — BP 118/88 | Ht 63.0 in | Wt 195.0 lb

## 2024-01-28 DIAGNOSIS — M25562 Pain in left knee: Secondary | ICD-10-CM

## 2024-01-28 DIAGNOSIS — G8929 Other chronic pain: Secondary | ICD-10-CM

## 2024-01-28 MED ORDER — DICLOFENAC SODIUM 75 MG PO TBEC
75.0000 mg | DELAYED_RELEASE_TABLET | Freq: Two times a day (BID) | ORAL | 1 refills | Status: AC
  Filled 2024-01-28: qty 60, 30d supply, fill #0

## 2024-01-28 NOTE — Progress Notes (Signed)
 PCP: No primary care provider on file.  Subjective:   HPI: Patient is a 48 y.o. female here for L knee pain. Primarily over lateral joint line. Pain is intermittent and most noticeable with walking or climbing up stairs. No radiation. No pain in her hip or along her thigh.  Her knee sometimes swells up and becomes warm but no fevers Ongoing for a few months, since April or May No known injury Denies numbness or weakness in her leg. No falls Her father gave her a toradol  injection into her knee about 1.5 weeks ago, this did not help She has otherwise tried oral NSAIDs with minimal improvement, reports some improvement with PO diclofenac     Past Medical History:  Diagnosis Date   Anxiety    no meds   Depression    no meds   Edema of both lower extremities    GERD (gastroesophageal reflux disease)    High blood pressure    Hypothyroid    Infertility, female    Obesity    Tuberculosis 04/17/2000   tested positive but no active tb, neg test xray   Vitamin D  deficiency     Current Outpatient Medications on File Prior to Visit  Medication Sig Dispense Refill   buPROPion  (WELLBUTRIN  XL) 150 MG 24 hr tablet Take 2 tablets (300 mg total) by mouth every morning. 30 tablet 0   Cholecalciferol (VITAMIN D3) 50 MCG (2000 UT) capsule Take 1 capsule (2,000 Units total) by mouth daily.     levonorgestrel  (MIRENA ) 20 MCG/DAY IUD 1 each by Intrauterine route.     levothyroxine  (SYNTHROID ) 100 MCG tablet Take 1 tablet (100 mcg total) by mouth daily. 30 tablet 5   Magnesium 400 MG CAPS Take by mouth daily at 12 noon.     metFORMIN  (GLUCOPHAGE -XR) 500 MG 24 hr tablet Take 1 tablet (500 mg total) by mouth 2 (two) times daily with a meal. (Patient not taking: Reported on 12/31/2023) 180 tablet 0   sertraline  (ZOLOFT ) 50 MG tablet Take 1.5 tablets (75 mg total) by mouth daily. (Patient taking differently: Take 75 mg by mouth daily. Taking 50 mg) 30 tablet 0   TURMERIC PO Take 2,250 mg by mouth daily.  (Patient not taking: Reported on 12/31/2023)     No current facility-administered medications on file prior to visit.    Past Surgical History:  Procedure Laterality Date   CESAREAN SECTION  2008, 2009   x 2   excision of pilonidal cyst  1994   WISDOM TOOTH EXTRACTION      No Known Allergies  BP 118/88   Ht 5' 3 (1.6 m)   Wt 195 lb (88.5 kg)   BMI 34.54 kg/m       No data to display              No data to display              Objective:  Physical Exam:  Gen: NAD, comfortable in exam room  L knee: Inspection: Effusion noted.  No gross deformity or ecchymosis Palpation: Nontender to palpation throughout knee, medial and lateral joint lines, or posteriorly ROM: Pain elicited with flexion of the knee past about 120 degrees.  Otherwise FROM Strength: 5/5 flexion and extension Special tests: Negative varus/valgus testing, negative anterior and posterior drawers, negative Apley's.  Some pain with Thessaly's. Equivocal Ober testing of IT   Limited US  of L knee showed significant effusion in suprapatellar pouch with intact quad tendon. The lateral  joint line was visualized and showed intact lateral meniscus however she did have some parameniscal cysts seen. Medial meniscus was intact. Patellar tendon, pes anserine were visualized without significant tearing. She also has what appears to be some minor tearing of the distal IT band close to its insertion although she was not tender over this area.   Assessment & Plan:  1. L lateral knee pain likely 2/2 lateral meniscus tear with joint effusion.  No known injury -Reassuringly preserved strength and ROM overall -Suspect most of her symptoms are related to the effusion/swelling - Start with conservative therapy, icing 3-4 times a day, compression sleeve and elevation as able.  Diclofenac  75 mg twice daily for pain - Provided home exercises to start with, can consider formal physical therapy -Consider aspiration/injection  plus or minus MRI if no improvement with conservative therapy and home exercises -Follow-up in 6 weeks

## 2024-01-28 NOTE — Patient Instructions (Signed)
 This is consistent with a lateral meniscus tear though your swelling from this is causing a majority of your symptoms. Ice 15 minutes at a time 3-4 times a day. Diclofenac  75mg  twice a day with food for pain and inflammation. Compression sleeve during the day is important. Do home exercises daily - let us  know if you want to do formal physical therapy. Elevate above your heart when possible. Consider aspiration/injection, MRI, physical therapy. Follow up in 6 weeks but message me with any questions in the meantime.

## 2024-02-03 ENCOUNTER — Other Ambulatory Visit (HOSPITAL_COMMUNITY): Payer: Self-pay

## 2024-02-04 ENCOUNTER — Other Ambulatory Visit (HOSPITAL_COMMUNITY): Payer: Self-pay

## 2024-02-04 MED ORDER — SERTRALINE HCL 50 MG PO TABS
75.0000 mg | ORAL_TABLET | Freq: Every day | ORAL | 0 refills | Status: DC
  Filled 2024-02-04: qty 30, 20d supply, fill #0

## 2024-02-04 MED ORDER — BUPROPION HCL ER (XL) 150 MG PO TB24
300.0000 mg | ORAL_TABLET | Freq: Every morning | ORAL | 0 refills | Status: DC
  Filled 2024-02-04: qty 30, 15d supply, fill #0

## 2024-03-17 ENCOUNTER — Ambulatory Visit (INDEPENDENT_AMBULATORY_CARE_PROVIDER_SITE_OTHER): Admitting: Family Medicine

## 2024-03-17 VITALS — BP 135/80 | HR 73 | Temp 98.2°F | Ht 63.0 in | Wt 195.0 lb

## 2024-03-17 DIAGNOSIS — E66811 Obesity, class 1: Secondary | ICD-10-CM

## 2024-03-17 DIAGNOSIS — F321 Major depressive disorder, single episode, moderate: Secondary | ICD-10-CM | POA: Diagnosis not present

## 2024-03-17 DIAGNOSIS — R7303 Prediabetes: Secondary | ICD-10-CM

## 2024-03-17 DIAGNOSIS — Z6834 Body mass index (BMI) 34.0-34.9, adult: Secondary | ICD-10-CM

## 2024-03-17 NOTE — Progress Notes (Unsigned)
 SUBJECTIVE:  Chief Complaint: Obesity  Interim History: Patient has been working more and more since our last appointment.  She is going to [place] tomorrow with a roommate from college.  Interested in starting on injectable therapy when she returns from her trip.    Gustavo is here to discuss her progress with her obesity treatment plan. She is on the Category 3 Plan and states she is following her eating plan approximately 50 % of the time. She states she is walking 6500-8500 steps 4 times per week.   OBJECTIVE: Visit Diagnoses: Problem List Items Addressed This Visit       Other   Class 1 obesity with serious comorbidity and body mass index (BMI) of 34.0 to 34.9 in adult   Prediabetes - Primary   Other Visit Diagnoses       Depression, major, single episode, moderate (HCC)         BMI 34.0-34.9,adult           Vitals Temp: 98.2 F (36.8 C) BP: 135/80 Pulse Rate: 73 SpO2: 97 %   Anthropometric Measurements Height: 5' 3 (1.6 m) Weight: 195 lb (88.5 kg) BMI (Calculated): 34.55 Weight at Last Visit: 195 lb Weight Lost Since Last Visit: 0 Weight Gained Since Last Visit: 0 Starting Weight: 197 lb Total Weight Loss (lbs): 2 lb (0.907 kg)   Body Composition  Body Fat %: 42.2 % Fat Mass (lbs): 82.4 lbs Muscle Mass (lbs): 107.2 lbs Total Body Water (lbs): 79 lbs Visceral Fat Rating : 11   Other Clinical Data Today's Visit #: 8 Starting Date: 11/29/22 Comments: Cat 3     ASSESSMENT AND PLAN: Assessment & Plan Prediabetes Patient is working on limiting simple carbohydrates and being mindful when she eats however her biggest obstacle is navigating how to incorporate mindful eating in her home where her daughter is currently undergoing treatment for an eating disorder.  Patient is not taking metformin  at this time however previously was on metformin .  Does not need a refill.  Discussed possibility of starting GLP-1 injectable therapy prior to next  appointment. Depression, major, single episode, moderate (HCC) Patient has significant familial stressors and  is open to pursuing therapy at this time.  Will refer to psychology.  Patient was encouraged to schedule appointment at their earliest convenience.  No suicidal or homicidal ideation at this time. BMI 34.0-34.9,adult  Class 1 obesity with serious comorbidity and body mass index (BMI) of 34.0 to 34.9 in adult, unspecified obesity type    Diet: Clara is currently in the action stage of change. As such, her goal is to continue with weight loss efforts and has agreed to the Category 3 Plan.   Exercise:  For substantial health benefits, adults should do at least 150 minutes (2 hours and 30 minutes) a week of moderate-intensity, or 75 minutes (1 hour and 15 minutes) a week of vigorous-intensity aerobic physical activity, or an equivalent combination of moderate- and vigorous-intensity aerobic activity. Aerobic activity should be performed in episodes of at least 10 minutes, and preferably, it should be spread throughout the week.  Behavior Modification:  We discussed the following Behavioral Modification Strategies today: increasing lean protein intake, decreasing simple carbohydrates, increasing vegetables, meal planning and cooking strategies, travel eating strategies, holiday eating strategies, and planning for success. We discussed various medication options to help Giulia with her weight loss efforts and we both agreed to contemplate usage of Wegovy  or Zepbound to start after her travel plans.  Return in  about 5 weeks (around 04/21/2024).   She was informed of the importance of frequent follow up visits to maximize her success with intensive lifestyle modifications for her multiple health conditions.  Attestation Statements:   Reviewed by clinician on day of visit: allergies, medications, problem list, medical history, surgical history, family history, social history, and previous  encounter notes.    Adelita Cho, MD

## 2024-03-18 ENCOUNTER — Other Ambulatory Visit (HOSPITAL_COMMUNITY): Payer: Self-pay

## 2024-03-18 MED ORDER — BUPROPION HCL ER (XL) 150 MG PO TB24
300.0000 mg | ORAL_TABLET | Freq: Every morning | ORAL | 0 refills | Status: DC
  Filled 2024-03-18: qty 30, 15d supply, fill #0

## 2024-03-18 NOTE — Assessment & Plan Note (Signed)
 Patient is working on limiting simple carbohydrates and being mindful when she eats however her biggest obstacle is navigating how to incorporate mindful eating in her home where her daughter is currently undergoing treatment for an eating disorder.  Patient is not taking metformin  at this time however previously was on metformin .  Does not need a refill.  Discussed possibility of starting GLP-1 injectable therapy prior to next appointment.

## 2024-03-26 ENCOUNTER — Encounter (INDEPENDENT_AMBULATORY_CARE_PROVIDER_SITE_OTHER): Payer: Self-pay | Admitting: Family Medicine

## 2024-03-27 NOTE — Telephone Encounter (Signed)
 She called about this

## 2024-03-31 NOTE — Telephone Encounter (Signed)
 Please advise

## 2024-04-02 ENCOUNTER — Other Ambulatory Visit (INDEPENDENT_AMBULATORY_CARE_PROVIDER_SITE_OTHER): Payer: Self-pay | Admitting: Family Medicine

## 2024-04-02 MED ORDER — TIRZEPATIDE-WEIGHT MANAGEMENT 5 MG/0.5ML ~~LOC~~ SOLN: 5.0000 mg | SUBCUTANEOUS | 0 refills | Status: DC

## 2024-04-21 ENCOUNTER — Ambulatory Visit (INDEPENDENT_AMBULATORY_CARE_PROVIDER_SITE_OTHER): Admitting: Family Medicine

## 2024-04-21 ENCOUNTER — Other Ambulatory Visit: Payer: Self-pay

## 2024-04-21 ENCOUNTER — Encounter (INDEPENDENT_AMBULATORY_CARE_PROVIDER_SITE_OTHER): Payer: Self-pay | Admitting: Family Medicine

## 2024-04-21 ENCOUNTER — Other Ambulatory Visit (HOSPITAL_COMMUNITY): Payer: Self-pay

## 2024-04-21 VITALS — BP 117/80 | HR 73 | Temp 97.9°F | Ht 63.0 in | Wt 190.0 lb

## 2024-04-21 DIAGNOSIS — R7303 Prediabetes: Secondary | ICD-10-CM

## 2024-04-21 DIAGNOSIS — F321 Major depressive disorder, single episode, moderate: Secondary | ICD-10-CM | POA: Diagnosis not present

## 2024-04-21 DIAGNOSIS — E66811 Obesity, class 1: Secondary | ICD-10-CM

## 2024-04-21 DIAGNOSIS — Z6833 Body mass index (BMI) 33.0-33.9, adult: Secondary | ICD-10-CM | POA: Diagnosis not present

## 2024-04-21 DIAGNOSIS — Z6834 Body mass index (BMI) 34.0-34.9, adult: Secondary | ICD-10-CM

## 2024-04-21 MED ORDER — SERTRALINE HCL 50 MG PO TABS
75.0000 mg | ORAL_TABLET | Freq: Every day | ORAL | 0 refills | Status: AC
  Filled 2024-04-21 – 2024-04-22 (×2): qty 135, 90d supply, fill #0

## 2024-04-21 MED ORDER — TIRZEPATIDE-WEIGHT MANAGEMENT 5 MG/0.5ML ~~LOC~~ SOLN: 5.0000 mg | SUBCUTANEOUS | 0 refills | Status: DC

## 2024-04-21 MED ORDER — METFORMIN HCL ER 500 MG PO TB24
500.0000 mg | ORAL_TABLET | Freq: Two times a day (BID) | ORAL | 0 refills | Status: AC
  Filled 2024-04-21: qty 180, 90d supply, fill #0

## 2024-04-21 MED ORDER — BUPROPION HCL ER (XL) 150 MG PO TB24
300.0000 mg | ORAL_TABLET | Freq: Every morning | ORAL | 0 refills | Status: AC
  Filled 2024-04-21 – 2024-04-22 (×2): qty 180, 90d supply, fill #0
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
## 2024-04-22 ENCOUNTER — Other Ambulatory Visit (HOSPITAL_COMMUNITY): Payer: Self-pay

## 2024-04-22 ENCOUNTER — Encounter: Payer: Self-pay | Admitting: Pharmacist

## 2024-04-22 ENCOUNTER — Other Ambulatory Visit: Payer: Self-pay

## 2024-04-23 ENCOUNTER — Other Ambulatory Visit: Payer: Self-pay

## 2024-04-27 NOTE — Assessment & Plan Note (Signed)
 Tolerating metformin  with no GI side effects.  Needs refill today.  Given recent start of tirzepatide  will continue metformin  at current dose.  Refill sent to pharmacy today.

## 2024-04-30 ENCOUNTER — Ambulatory Visit: Admitting: Licensed Clinical Social Worker

## 2024-04-30 DIAGNOSIS — F330 Major depressive disorder, recurrent, mild: Secondary | ICD-10-CM | POA: Diagnosis not present
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
## 2024-05-20 ENCOUNTER — Ambulatory Visit (INDEPENDENT_AMBULATORY_CARE_PROVIDER_SITE_OTHER): Admitting: Family Medicine

## 2024-05-20 VITALS — BP 122/78 | HR 76 | Temp 98.2°F | Ht 63.0 in | Wt 182.0 lb

## 2024-05-20 DIAGNOSIS — R7303 Prediabetes: Secondary | ICD-10-CM

## 2024-05-20 DIAGNOSIS — E7800 Pure hypercholesterolemia, unspecified: Secondary | ICD-10-CM

## 2024-05-20 DIAGNOSIS — Z6832 Body mass index (BMI) 32.0-32.9, adult: Secondary | ICD-10-CM

## 2024-05-20 DIAGNOSIS — Z6834 Body mass index (BMI) 34.0-34.9, adult: Secondary | ICD-10-CM

## 2024-05-20 MED ORDER — TIRZEPATIDE-WEIGHT MANAGEMENT 5 MG/0.5ML ~~LOC~~ SOLN: 5.0000 mg | SUBCUTANEOUS | 0 refills | Status: AC

## 2024-05-20 NOTE — Assessment & Plan Note (Signed)
 Last A1c elevated on labs done in August.  Patient is not on tirzepatide  weekly and doing well without GI side effects.  Establishing care with a new PCP tomorrow.  If labs done by PCP tomorrow will discuss results at next appointment.

## 2024-05-21 ENCOUNTER — Ambulatory Visit: Admitting: Internal Medicine

## 2024-05-21 ENCOUNTER — Encounter: Payer: Self-pay | Admitting: Internal Medicine

## 2024-05-21 VITALS — BP 120/84 | HR 65 | Temp 98.2°F | Ht 63.0 in | Wt 188.0 lb

## 2024-05-21 DIAGNOSIS — E559 Vitamin D deficiency, unspecified: Secondary | ICD-10-CM

## 2024-05-21 DIAGNOSIS — E039 Hypothyroidism, unspecified: Secondary | ICD-10-CM

## 2024-05-21 DIAGNOSIS — Z23 Encounter for immunization: Secondary | ICD-10-CM

## 2024-05-21 DIAGNOSIS — E6609 Other obesity due to excess calories: Secondary | ICD-10-CM

## 2024-05-21 DIAGNOSIS — R7303 Prediabetes: Secondary | ICD-10-CM

## 2024-05-21 DIAGNOSIS — Z1159 Encounter for screening for other viral diseases: Secondary | ICD-10-CM

## 2024-05-21 LAB — POCT GLYCOSYLATED HEMOGLOBIN (HGB A1C): Hemoglobin A1C: 5.6 % (ref 4.0–5.6)

## 2024-05-21 NOTE — Progress Notes (Signed)
 "   New Patient Office Visit     CC/Reason for Visit: Establish care, discuss chronic medical conditions Previous PCP: Unknown Last Visit: 2025  HPI: [name] is a 49 y.o. female who is coming in today for the above mentioned reasons. Past Medical History is significant for: Hypothyroidism, impaired glucose tolerance, vitamin D  deficiency and obesity.  Feeling well.  No major concerns or complaints.  Requesting labs, cancer screening is up-to-date, will obtain records.  Due for Tdap.   Past Medical/Surgical History: Past Medical History:  Diagnosis Date   Anxiety    no meds   Depression    no meds   Edema of both lower extremities    GERD (gastroesophageal reflux disease)    High blood pressure    Hypothyroid    Infertility, female    Obesity    Tuberculosis 04/17/2000   tested positive but no active tb, neg test xray   Vitamin D  deficiency     Past Surgical History:  Procedure Laterality Date   CESAREAN SECTION  2008, 2009   x 2   excision of pilonidal cyst  1994   WISDOM TOOTH EXTRACTION      Social History:  reports that she has never smoked. She has never used smokeless tobacco. She reports that she does not drink alcohol and does not use drugs.  Allergies: Allergies[1]  Family History:  Family History  Problem Relation Age of Onset   Heart disease Mother    Thyroid  disease Mother    Cancer Mother    High blood pressure Father     Current Medications[2]  Review of Systems:  Negative except as indicated in HPI.   Physical Exam: Vitals:   05/21/24 1411  BP: 120/84  Pulse: 65  Temp: 98.2 F (36.8 C)  TempSrc: Oral  SpO2: 98%  Weight: 188 lb (85.3 kg)  Height: 5' 3 (1.6 m)   Body mass index is 33.3 kg/m.  Physical Exam Vitals reviewed.  Constitutional:      Appearance: Normal appearance.  HENT:     Head: Normocephalic and atraumatic.  Eyes:     Conjunctiva/sclera: Conjunctivae normal.  Cardiovascular:     Rate and Rhythm:  Normal rate and regular rhythm.  Pulmonary:     Effort: Pulmonary effort is normal.     Breath sounds: Normal breath sounds.  Skin:    General: Skin is warm and dry.  Neurological:     General: No focal deficit present.     Mental Status: She is alert and oriented to person, place, and time.  Psychiatric:        Mood and Affect: Mood normal.        Behavior: Behavior normal.        Thought Content: Thought content normal.        Judgment: Judgment normal.       Impression and Plan:  Prediabetes -     POCT glycosylated hemoglobin (Hb A1C)  Acquired hypothyroidism -     TSH; Future  Class 1 obesity due to excess calories without serious comorbidity with body mass index (BMI) of 33.0 to 33.9 in adult -     Hepatitis B surface antibody,qualitative -     Comprehensive metabolic panel with GFR; Future -     CBC with Differential/Platelet; Future -     Lipid panel; Future  Vitamin D  deficiency -     VITAMIN D  25 Hydroxy (Vit-D Deficiency, Fractures); Future  Encounter for hepatitis C  screening test for low risk patient -     Hepatitis C antibody; Future  Immunization due  -Tdap in office today. - Check basic labs including TSH to follow hypothyroidism. - A1c is 5.6 which remains in the prediabetic range.  Continue to work on lifestyle changes.  On Zepbound  for weight loss. 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  Time spent: 46 minutes reviewing chart, interviewing and examining patient and formulating plan of care.       Tully Theophilus Andrews, MD [facility]    [1] No Known Allergies [2]  Current Outpatient Medications:    buPROPion  (WELLBUTRIN  XL) 150 MG 24 hr tablet, Take 2 tablets (300 mg total) by mouth every morning., Disp: 180 tablet, Rfl: 0   Cholecalciferol (VITAMIN D3) 50 MCG (2000 UT) capsule, Take 1 capsule (2,000 Units total) by mouth daily., Disp: ,  Rfl:    levonorgestrel  (MIRENA ) 20 MCG/DAY IUD, 1 each by Intrauterine route., Disp: , Rfl:    levothyroxine  (SYNTHROID ) 100 MCG tablet, Take 1 tablet (100 mcg total) by mouth daily., Disp: 30 tablet, Rfl: 5   sertraline  (ZOLOFT ) 50 MG tablet, Take 1.5 tablets (75 mg total) by mouth daily., Disp: 135 tablet, Rfl: 0   tirzepatide  5 MG/0.5ML injection vial, Inject 5 mg into the skin once a week., Disp: 2 mL, Rfl: 0   diclofenac  (VOLTAREN ) 75 MG EC tablet, Take 1 tablet (75 mg total) by mouth 2 (two) times daily. (Patient not taking: Reported on 05/21/2024), Disp: 60 tablet, Rfl: 1   Magnesium 400 MG CAPS, Take by mouth daily at 12 noon. (Patient not taking: Reported on 05/21/2024), Disp: , Rfl:    metFORMIN  (GLUCOPHAGE -XR) 500 MG 24 hr tablet, Take 1 tablet (500 mg total) by mouth 2 (two) times daily with a meal. (Patient not taking: Reported on 05/21/2024), Disp: 180 tablet, Rfl: 0   TURMERIC PO, Take 2,250 mg by mouth daily. (Patient not taking: Reported on 05/21/2024), Disp: , Rfl:   "

## 2024-05-21 NOTE — Addendum Note (Signed)
 Addended by: KATHRYNE MILLMAN B on: 05/21/2024 03:58 PM   Modules accepted: Orders

## 2024-06-19 ENCOUNTER — Ambulatory Visit (INDEPENDENT_AMBULATORY_CARE_PROVIDER_SITE_OTHER): Admitting: Family Medicine
